# Patient Record
Sex: Male | Born: 1970 | Race: Black or African American | Hispanic: No | Marital: Married | Smoking: Current every day smoker
Health system: Southern US, Community
[De-identification: ages and names within clinical notes are randomized; demographics above are authoritative.]

## PROBLEM LIST (undated history)

## (undated) DIAGNOSIS — E7800 Pure hypercholesterolemia, unspecified: Secondary | ICD-10-CM

## (undated) DIAGNOSIS — L309 Dermatitis, unspecified: Secondary | ICD-10-CM

## (undated) HISTORY — PX: HAND TENDON SURGERY: SHX663

## (undated) HISTORY — DX: Dermatitis, unspecified: L30.9

---

## 2005-09-25 ENCOUNTER — Emergency Department: Payer: Self-pay | Admitting: Emergency Medicine

## 2009-01-01 ENCOUNTER — Emergency Department: Payer: Self-pay | Admitting: Emergency Medicine

## 2009-01-13 ENCOUNTER — Ambulatory Visit: Payer: Self-pay | Admitting: Specialist

## 2016-06-13 ENCOUNTER — Emergency Department
Admission: EM | Admit: 2016-06-13 | Discharge: 2016-06-13 | Disposition: A | Discharge status: Home / Self Care | Payer: 59 | Attending: Emergency Medicine | Admitting: Emergency Medicine

## 2016-06-13 DIAGNOSIS — K529 Noninfective gastroenteritis and colitis, unspecified: Secondary | ICD-10-CM | POA: Insufficient documentation

## 2016-06-13 DIAGNOSIS — R112 Nausea with vomiting, unspecified: Secondary | ICD-10-CM | POA: Diagnosis present

## 2016-06-13 LAB — CK: Total CK: 122 U/L (ref 49–397)

## 2016-06-13 LAB — LIPASE, BLOOD: Lipase: 17 U/L (ref 11–51)

## 2016-06-13 LAB — CBC
HCT: 45.7 % (ref 40.0–52.0)
Hemoglobin: 15 g/dL (ref 13.0–18.0)
MCH: 26.1 pg (ref 26.0–34.0)
MCHC: 32.7 g/dL (ref 32.0–36.0)
MCV: 79.8 fL — ABNORMAL LOW (ref 80.0–100.0)
Platelets: 387 K/uL (ref 150–440)
RBC: 5.73 MIL/uL (ref 4.40–5.90)
RDW: 14.6 % — ABNORMAL HIGH (ref 11.5–14.5)
WBC: 16 K/uL — ABNORMAL HIGH (ref 3.8–10.6)

## 2016-06-13 LAB — URINALYSIS, COMPLETE (UACMP) WITH MICROSCOPIC
Bacteria, UA: NONE SEEN
Bilirubin Urine: NEGATIVE
Glucose, UA: NEGATIVE mg/dL
KETONES UR: 5 mg/dL — AB
Leukocytes, UA: NEGATIVE
Nitrite: NEGATIVE
PH: 5 (ref 5.0–8.0)
Protein, ur: 30 mg/dL — AB
SPECIFIC GRAVITY, URINE: 1.033 — AB (ref 1.005–1.030)
Squamous Epithelial / HPF: NONE SEEN

## 2016-06-13 LAB — COMPREHENSIVE METABOLIC PANEL
ALK PHOS: 92 U/L (ref 38–126)
ALT: 33 U/L (ref 17–63)
AST: 24 U/L (ref 15–41)
Albumin: 4.4 g/dL (ref 3.5–5.0)
Anion gap: 9 (ref 5–15)
BUN: 13 mg/dL (ref 6–20)
CALCIUM: 9.2 mg/dL (ref 8.9–10.3)
CO2: 27 mmol/L (ref 22–32)
CREATININE: 0.96 mg/dL (ref 0.61–1.24)
Chloride: 100 mmol/L — ABNORMAL LOW (ref 101–111)
GFR calc Af Amer: >60 mL/min (ref >60)
GFR calc non Af Amer: >60 mL/min (ref >60)
GLUCOSE: 108 mg/dL — AB (ref 65–99)
Potassium: 3.4 mmol/L — ABNORMAL LOW (ref 3.5–5.1)
SODIUM: 136 mmol/L (ref 135–145)
Total Bilirubin: 0.6 mg/dL (ref 0.3–1.2)
Total Protein: 8.7 g/dL — ABNORMAL HIGH (ref 6.5–8.1)

## 2016-06-13 MED ORDER — ONDANSETRON 4 MG PO TBDP: 4.0000 mg | ORAL_TABLET | Freq: Three times a day (TID) | ORAL | 0 refills | Status: DC | PRN

## 2016-06-13 MED ORDER — ONDANSETRON HCL 4 MG/2ML IJ SOLN
4.0000 mg | Freq: Once | INTRAMUSCULAR | Status: AC
  Administered 2016-06-13: 4 mg via INTRAVENOUS

## 2016-06-13 MED ORDER — METRONIDAZOLE 500 MG PO TABS
ORAL_TABLET | ORAL | Status: AC
  Filled 2016-06-13: qty 1

## 2016-06-13 MED ORDER — SODIUM CHLORIDE 0.9 % IV SOLN
1000.0000 mL | Freq: Once | INTRAVENOUS | Status: AC
  Administered 2016-06-13: 1000 mL via INTRAVENOUS

## 2016-06-13 NOTE — ED Notes (Signed)
AAOx3.  Skin warm and dry.  NAD 

## 2016-06-13 NOTE — ED Triage Notes (Signed)
C/O mid abdominal pain.  Onset of symptoms 0300.  Reports vomiting and nausea.  States abdominal pain began yesterday and vomiting started this morning at 0300.

## 2016-06-13 NOTE — ED Provider Notes (Signed)
[facility] Emergency Department Provider Note   ____________________________________________    I have reviewed the triage vital signs and the nursing notes.   HISTORY  Chief Complaint Abdominal Pain     HPI Donald Benitez is a 46 y.o. male who presents with complaints of nausea vomiting and diarrhea. Patient reports the symptoms going on over the last 2 days. He denies significant abdominal pain but does report diffuse abdominal cramping. No sick contacts reported. No fevers reported. Has had mild body aches. No recent travel.   No past medical history on file.  There are no active problems to display for this patient.   No past surgical history on file.  Prior to Admission medications   Medication Sig Start Date End Date Taking? Authorizing Provider  ondansetron (ZOFRAN ODT) 4 MG disintegrating tablet Take 1 tablet (4 mg total) by mouth every 8 (eight) hours as needed for nausea or vomiting. 06/13/16   Jene Every, MD     Allergies Patient has no known allergies.  No family history on file.  Social History Social History  Substance Use Topics  . Smoking status: Not on file  . Smokeless tobacco: Not on file  . Alcohol use Not on file    Review of Systems  Constitutional: No fever/chills Eyes: No visual changes.  ENT: No sore throat. Cardiovascular: Denies chest pain. Respiratory: Denies shortness of breath. Gastrointestinal: Nausea vomiting and diarrhea as above, nonbilious nonbloody Genitourinary: Negative for dysuria. Musculoskeletal: Negative for back pain. Skin: Negative for rash. Neurological: Negative for headaches or weakness   ____________________________________________   PHYSICAL EXAM:  VITAL SIGNS: ED Triage Vitals  Enc Vitals Group     BP 06/13/16 0922 112/77     Pulse Rate 06/13/16 0922 86     Resp 06/13/16 0922 18     Temp 06/13/16 0922 98.4 F (36.9 C)     Temp Source 06/13/16 0922 Oral     SpO2  06/13/16 0922 100 %     Weight 06/13/16 0922 195 lb (88.5 kg)     Height 06/13/16 0922  (1.676 m)     Head Circumference --      Peak Flow --      Pain Score 06/13/16 0927 7     Pain Loc --      Pain Edu? --      Excl. in GC? --     Constitutional: Alert and oriented. No acute distress. Pleasant and interactive Eyes: Conjunctivae are normal.   Nose: No congestion/rhinnorhea. Mouth/Throat: Mucous membranes are dry  Cardiovascular: Normal rate, regular rhythm. Grossly normal heart sounds.  Good peripheral circulation. Respiratory: Normal respiratory effort.  No retractions. Lungs CTAB. Gastrointestinal: Soft and nontender. No distention.  No CVA tenderness. Genitourinary: deferred Musculoskeletal:   Warm and well perfused Neurologic:  Normal speech and language. No gross focal neurologic deficits are appreciated.  Skin:  Skin is warm, dry and intact. No rash noted. Psychiatric: Mood and affect are normal. Speech and behavior are normal.  ____________________________________________   LABS (all labs ordered are listed, but only abnormal results are displayed)  Labs Reviewed  COMPREHENSIVE METABOLIC PANEL - Abnormal; Notable for the following:       Result Value   Potassium 3.4 (*)    Chloride 100 (*)    Glucose, Bld 108 (*)    Total Protein 8.7 (*)    All other components within normal limits  CBC - Abnormal; Notable for the following:  WBC 16.0 (*)    MCV 79.8 (*)    RDW 14.6 (*)    All other components within normal limits  URINALYSIS, COMPLETE (UACMP) WITH MICROSCOPIC - Abnormal; Notable for the following:    Color, Urine AMBER (*)    APPearance HAZY (*)    Specific Gravity, Urine 1.033 (*)    Hgb urine dipstick MODERATE (*)    Ketones, ur 5 (*)    Protein, ur 30 (*)    All other components within normal limits  LIPASE, BLOOD  CK    ____________________________________________  EKG  None ____________________________________________  RADIOLOGY  None ____________________________________________   PROCEDURES  Procedure(s) performed: No    Critical Care performed: No ____________________________________________   INITIAL IMPRESSION / ASSESSMENT AND PLAN / ED COURSE  Pertinent labs & imaging results that were available during my care of the patient were reviewed by me and considered in my medical decision making (see chart for details).  Patient overall well-appearing and in no acute distress. Abdominal exam is benign. Lab work demonstrated delayed white blood cell count, symptoms are most consistent with gastroenteritis, likely viral. This is likely the cause of his elevated white blood cell count, we will treat with IV Zofran and IV fluids and reevaluate   After fluids patient reported feeling significantly improved. I'll discharge with by mouth Zofran, return precautions discussed    ____________________________________________   FINAL CLINICAL IMPRESSION(S) / ED DIAGNOSES  Final diagnoses:  Gastroenteritis      NEW MEDICATIONS STARTED DURING THIS VISIT:  Discharge Medication List as of 06/13/2016  1:07 PM    START taking these medications   Details  ondansetron (ZOFRAN ODT) 4 MG disintegrating tablet Take 1 tablet (4 mg total) by mouth every 8 (eight) hours as needed for nausea or vomiting., Starting Mon 06/13/2016, Print         Note:  This document was prepared using Dragon voice recognition software and may include unintentional dictation errors.    Jene Every, MD 06/13/16 [phone]

## 2016-09-19 ENCOUNTER — Emergency Department
Admission: EM | Admit: 2016-09-19 | Discharge: 2016-09-19 | Disposition: A | Discharge status: Home / Self Care | Payer: 59 | Attending: Emergency Medicine | Admitting: Emergency Medicine

## 2016-09-19 ENCOUNTER — Encounter: Payer: Self-pay | Admitting: *Deleted

## 2016-09-19 ENCOUNTER — Emergency Department: Payer: 59

## 2016-09-19 DIAGNOSIS — S0502XA Injury of conjunctiva and corneal abrasion without foreign body, left eye, initial encounter: Secondary | ICD-10-CM | POA: Diagnosis not present

## 2016-09-19 DIAGNOSIS — F17210 Nicotine dependence, cigarettes, uncomplicated: Secondary | ICD-10-CM | POA: Insufficient documentation

## 2016-09-19 DIAGNOSIS — Y92410 Unspecified street and highway as the place of occurrence of the external cause: Secondary | ICD-10-CM | POA: Insufficient documentation

## 2016-09-19 DIAGNOSIS — Y999 Unspecified external cause status: Secondary | ICD-10-CM | POA: Insufficient documentation

## 2016-09-19 DIAGNOSIS — S161XXA Strain of muscle, fascia and tendon at neck level, initial encounter: Secondary | ICD-10-CM | POA: Insufficient documentation

## 2016-09-19 DIAGNOSIS — Y9389 Activity, other specified: Secondary | ICD-10-CM | POA: Diagnosis not present

## 2016-09-19 DIAGNOSIS — G44319 Acute post-traumatic headache, not intractable: Secondary | ICD-10-CM | POA: Insufficient documentation

## 2016-09-19 MED ORDER — KETOROLAC TROMETHAMINE 30 MG/ML IJ SOLN
30.0000 mg | Freq: Once | INTRAMUSCULAR | Status: AC
  Administered 2016-09-19: 30 mg via INTRAMUSCULAR
  Filled 2016-09-19: qty 1

## 2016-09-19 MED ORDER — FLUORESCEIN SODIUM 0.6 MG OP STRP
ORAL_STRIP | OPHTHALMIC | Status: AC
  Administered 2016-09-19: 1 via OPHTHALMIC
  Filled 2016-09-19: qty 1

## 2016-09-19 MED ORDER — FLUORESCEIN SODIUM 0.6 MG OP STRP
1.0000 | ORAL_STRIP | Freq: Once | OPHTHALMIC | Status: AC
  Administered 2016-09-19: 1 via OPHTHALMIC
  Filled 2016-09-19: qty 1

## 2016-09-19 MED ORDER — ORPHENADRINE CITRATE 30 MG/ML IJ SOLN
60.0000 mg | Freq: Once | INTRAMUSCULAR | Status: AC
  Administered 2016-09-19: 60 mg via INTRAMUSCULAR
  Filled 2016-09-19: qty 2

## 2016-09-19 MED ORDER — EYE WASH OPHTH SOLN
OPHTHALMIC | Status: AC
  Filled 2016-09-19: qty 118

## 2016-09-19 MED ORDER — KETOROLAC TROMETHAMINE 0.5 % OP SOLN: 1.0000 [drp] | Freq: Four times a day (QID) | OPHTHALMIC | 0 refills | Status: DC

## 2016-09-19 MED ORDER — POLYMYXIN B-TRIMETHOPRIM 10000-0.1 UNIT/ML-% OP SOLN: 2.0000 [drp] | Freq: Two times a day (BID) | OPHTHALMIC | 0 refills | Status: DC

## 2016-09-19 MED ORDER — TETRACAINE HCL 0.5 % OP SOLN
2.0000 [drp] | Freq: Once | OPHTHALMIC | Status: AC
  Administered 2016-09-19: 2 [drp] via OPHTHALMIC
  Filled 2016-09-19: qty 4

## 2016-09-19 MED ORDER — METHOCARBAMOL 500 MG PO TABS: 500.0000 mg | ORAL_TABLET | Freq: Four times a day (QID) | ORAL | 0 refills | Status: DC

## 2016-09-19 MED ORDER — TETRACAINE HCL 0.5 % OP SOLN
OPHTHALMIC | Status: AC
  Administered 2016-09-19: 2 [drp] via OPHTHALMIC
  Filled 2016-09-19: qty 4

## 2016-09-19 MED ORDER — MELOXICAM 15 MG PO TABS: 15.0000 mg | ORAL_TABLET | Freq: Every day | ORAL | 0 refills | Status: DC

## 2016-09-19 NOTE — ED Notes (Signed)
Pt made aware of medical need to stay after IM administration for monitoring. Pt states understanding but states "I'm good I dont need to stay."
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
## 2016-09-21 ENCOUNTER — Emergency Department
Admission: EM | Admit: 2016-09-21 | Discharge: 2016-09-21 | Disposition: A | Discharge status: Home / Self Care | Payer: 59 | Attending: Emergency Medicine | Admitting: Emergency Medicine

## 2016-09-21 ENCOUNTER — Encounter: Payer: Self-pay | Admitting: Emergency Medicine

## 2016-09-21 DIAGNOSIS — M62838 Other muscle spasm: Secondary | ICD-10-CM | POA: Diagnosis not present

## 2016-09-21 DIAGNOSIS — F17200 Nicotine dependence, unspecified, uncomplicated: Secondary | ICD-10-CM | POA: Insufficient documentation

## 2016-09-21 DIAGNOSIS — Y999 Unspecified external cause status: Secondary | ICD-10-CM | POA: Diagnosis not present

## 2016-09-21 DIAGNOSIS — Y92410 Unspecified street and highway as the place of occurrence of the external cause: Secondary | ICD-10-CM | POA: Diagnosis not present

## 2016-09-21 DIAGNOSIS — S161XXA Strain of muscle, fascia and tendon at neck level, initial encounter: Secondary | ICD-10-CM | POA: Diagnosis not present

## 2016-09-21 DIAGNOSIS — S199XXA Unspecified injury of neck, initial encounter: Secondary | ICD-10-CM | POA: Diagnosis present

## 2016-09-21 DIAGNOSIS — Y939 Activity, unspecified: Secondary | ICD-10-CM | POA: Diagnosis not present

## 2016-09-21 MED ORDER — CYCLOBENZAPRINE HCL 5 MG PO TABS: 5.0000 mg | ORAL_TABLET | Freq: Three times a day (TID) | ORAL | 0 refills | Status: DC | PRN

## 2016-09-21 MED ORDER — DEXAMETHASONE SODIUM PHOSPHATE 10 MG/ML IJ SOLN
10.0000 mg | Freq: Once | INTRAMUSCULAR | Status: AC
  Administered 2016-09-21: 10 mg via INTRAMUSCULAR
  Filled 2016-09-21: qty 1

## 2016-09-21 MED ORDER — CYCLOBENZAPRINE HCL 10 MG PO TABS
5.0000 mg | ORAL_TABLET | Freq: Once | ORAL | Status: AC
  Administered 2016-09-21: 5 mg via ORAL
  Filled 2016-09-21: qty 1

## 2016-09-21 MED ORDER — PREDNISONE 10 MG (21) PO TBPK: ORAL_TABLET | ORAL | 0 refills | Status: DC
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
## 2016-09-21 NOTE — Discharge Instructions (Signed)
Take medication as prescribed. Return to emergency department if symptoms worsen and follow-up with orthopedics as needed.

## 2016-09-21 NOTE — ED Notes (Signed)
First nurse: pt report neck and shoulder pain
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
## 2016-09-24 ENCOUNTER — Emergency Department
Admission: EM | Admit: 2016-09-24 | Discharge: 2016-09-24 | Disposition: A | Discharge status: Home / Self Care | Payer: 59 | Attending: Emergency Medicine | Admitting: Emergency Medicine

## 2016-09-24 ENCOUNTER — Encounter: Payer: Self-pay | Admitting: Emergency Medicine

## 2016-09-24 DIAGNOSIS — Y998 Other external cause status: Secondary | ICD-10-CM | POA: Diagnosis not present

## 2016-09-24 DIAGNOSIS — Y92410 Unspecified street and highway as the place of occurrence of the external cause: Secondary | ICD-10-CM | POA: Diagnosis not present

## 2016-09-24 DIAGNOSIS — Y9389 Activity, other specified: Secondary | ICD-10-CM | POA: Diagnosis not present

## 2016-09-24 DIAGNOSIS — S0990XA Unspecified injury of head, initial encounter: Secondary | ICD-10-CM

## 2016-09-24 DIAGNOSIS — S199XXA Unspecified injury of neck, initial encounter: Secondary | ICD-10-CM

## 2016-09-24 DIAGNOSIS — G44309 Post-traumatic headache, unspecified, not intractable: Secondary | ICD-10-CM | POA: Diagnosis not present

## 2016-09-24 DIAGNOSIS — R51 Headache: Secondary | ICD-10-CM | POA: Diagnosis present
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
## 2016-09-24 NOTE — Discharge Instructions (Signed)
Advise only extra strength Tylenol for headache until evaluation by neurologist.

## 2019-01-23 ENCOUNTER — Other Ambulatory Visit: Payer: Self-pay

## 2019-01-23 DIAGNOSIS — Z20822 Contact with and (suspected) exposure to covid-19: Secondary | ICD-10-CM

## 2019-01-27 ENCOUNTER — Telehealth: Payer: Self-pay | Admitting: General Practice

## 2019-01-27 LAB — NOVEL CORONAVIRUS, NAA: SARS-CoV-2, NAA: DETECTED — AB

## 2019-01-27 NOTE — Telephone Encounter (Signed)
Pt called for covid results.  Pleas call pt back at [phone] Thanks

## 2019-09-20 ENCOUNTER — Encounter: Payer: Self-pay | Admitting: Urology

## 2019-09-20 ENCOUNTER — Other Ambulatory Visit: Payer: Self-pay

## 2019-09-20 ENCOUNTER — Ambulatory Visit (INDEPENDENT_AMBULATORY_CARE_PROVIDER_SITE_OTHER): Payer: 59 | Admitting: Urology

## 2019-09-20 ENCOUNTER — Telehealth: Payer: Self-pay | Admitting: Urology

## 2019-09-20 VITALS — BP 122/75 | HR 71 | Ht 66.0 in | Wt 186.0 lb

## 2019-09-20 DIAGNOSIS — R3129 Other microscopic hematuria: Secondary | ICD-10-CM | POA: Diagnosis not present

## 2019-09-20 LAB — URINALYSIS, COMPLETE
Bilirubin, UA: NEGATIVE
Glucose, UA: NEGATIVE
Ketones, UA: NEGATIVE
Leukocytes,UA: NEGATIVE
Nitrite, UA: NEGATIVE
Protein,UA: NEGATIVE
Specific Gravity, UA: >1.03 — ABNORMAL HIGH (ref 1.005–1.030)
Urobilinogen, Ur: 1 mg/dL (ref 0.2–1.0)
pH, UA: 6 (ref 5.0–7.5)

## 2019-09-20 LAB — MICROSCOPIC EXAMINATION: Bacteria, UA: NONE SEEN

## 2019-09-20 NOTE — Progress Notes (Signed)
09/20/2019 8:49 AM   Donald Benitez [birth year] [id number]  Referring provider: Preston Fleeting, MD [address] [place],  [place] [address]  Chief Complaint  Patient presents with  . Hematuria    HPI: Donald Benitez is a 49 y.o. male seen at the request of Dr. Neita Goodnight for evaluation of microhematuria.   Urinalyses 08/11/2019 and 09/10/2019 dipstick positive for blood  No urine microscopy was ordered  Denies gross hematuria  Denies bothersome lower urinary tract symptoms  Denies flank, abdominal or pelvic pain  No previous history of urologic problems  1 pack/day smoker x22 years  PSA 1.1  PMH: History reviewed. No pertinent past medical history.  Surgical History: History reviewed. No pertinent surgical history.  Home Medications:  Allergies as of 09/20/2019   No Known Allergies     Medication List       Accurate as of September 20, 2019  8:49 AM. If you have any questions, ask your nurse or doctor.        cyclobenzaprine 5 MG tablet Commonly known as: FLEXERIL Take 1 tablet (5 mg total) by mouth 3 (three) times daily as needed.   hydrOXYzine 25 MG capsule Commonly known as: VISTARIL hydroxyzine pamoate 25 mg capsule   hydrOXYzine 25 MG tablet Commonly known as: ATARAX/VISTARIL Take 50 mg by mouth at bedtime.   ketorolac 0.5 % ophthalmic solution Commonly known as: Acular Place 1 drop into the right eye 4 (four) times daily.   meloxicam 15 MG tablet Commonly known as: MOBIC Take 1 tablet (15 mg total) by mouth daily.   methocarbamol 500 MG tablet Commonly known as: Robaxin Take 1 tablet (500 mg total) by mouth 4 (four) times daily.   ondansetron 4 MG disintegrating tablet Commonly known as: Zofran ODT Take 1 tablet (4 mg total) by mouth every 8 (eight) hours as needed for nausea or vomiting.   predniSONE 10 MG (21) Tbpk tablet Commonly known as: STERAPRED UNI-PAK 21 TAB Take 6 tablets on day 1. Take 5 tablets on day 2. Take  4 tablets on day 3. Take 3 tablets on day 4. Take 2 tablets on day 5. Take 1 tablets on day 6.   Protopic 0.03 % ointment Generic drug: tacrolimus Protopic 0.03 % topical ointment  APPLY A THIN LAYER TO THE AFFECTED AREA(S) AS NEEDED.   triamcinolone cream 0.5 % Commonly known as: KENALOG Apply topically 2 (two) times daily.   trimethoprim-polymyxin b ophthalmic solution Commonly known as: POLYTRIM Place 2 drops into the left eye 2 (two) times daily.       Allergies: No Known Allergies  Family History: History reviewed. No pertinent family history.  Social History:  reports that he has been smoking. He has never used smokeless tobacco. He reports current alcohol use. He reports that he does not use drugs.   Physical Exam: BP 122/75   Pulse 71   Ht 5\' 6"  (1.676 m)   Wt 186 lb (84.4 kg)   BMI 30.02 kg/m   Constitutional:  Alert and oriented, No acute distress. HEENT: [place] AT, moist mucus membranes.  Trachea midline, no masses. Cardiovascular: No clubbing, cyanosis, or edema. Respiratory: Normal respiratory effort, no increased work of breathing. GI: Abdomen is soft, nontender, nondistended, no abdominal masses GU: Phallus without lesions, testes descended bilaterally without masses or tenderness Skin: No rashes, bruises or suspicious lesions. Neurologic: Grossly intact, no focal deficits, moving all 4 extremities. Psychiatric: Normal mood and affect.  Laboratory Data:  Urinalysis Dipstick  1+ blood Microscopy 3-10 RBC   Assessment & Plan:    1. Microscopic hematuria  Based on American Urological Association practice guidelines asymptomatic microhematuria [facility]) is defined as three or greater red blood cells per high powered field on a properly collected urinary specimen in the absence of an obvious benign cause. A positive dipstick does not define AMH, and evaluation should be based solely on findings from microscopic examination of urinary sediment and not on a  dipstick reading.  Urinalysis with microscopy in the office today does show 3-10 RBCs  Based on age, tobacco history and urine microscopy he is intermediate risk stratification  Standard recommended evaluation of intermediate risk hematuria was discussed and will schedule renal ultrasound and cystoscopy   Riki Altes, MD  [facility] [address] [place], [place] [address] [phone]

## 2019-09-20 NOTE — Telephone Encounter (Addendum)
Urinalysis did show an abnormal amount of RBCs on microscopic examination.  Recommend scheduling a renal ultrasound and cystoscopy  I put in the ultrasound order.  He needs a cystoscopy appointment with review of ultrasound results

## 2019-09-23 NOTE — Telephone Encounter (Signed)
Notified patient as instructed, patient pleased. Discussed follow-up appointments, patient agrees  

## 2019-09-29 ENCOUNTER — Encounter: Payer: Self-pay | Admitting: Urology

## 2019-10-09 ENCOUNTER — Ambulatory Visit
Admission: RE | Admit: 2019-10-09 | Discharge: 2019-10-09 | Disposition: A | Discharge status: Home / Self Care | Payer: 59 | Source: Ambulatory Visit | Attending: Urology | Admitting: Urology

## 2019-10-09 ENCOUNTER — Other Ambulatory Visit: Payer: Self-pay

## 2019-10-09 DIAGNOSIS — R3129 Other microscopic hematuria: Secondary | ICD-10-CM | POA: Insufficient documentation

## 2019-10-23 NOTE — Progress Notes (Signed)
° °  10/24/2019  CC:  Chief Complaint  Patient presents with   Cysto    Indications: Microhematuria  HPI: Donald Benitez is a 49 y.o. male returns today for a cystoscopy.    RUS on 10/09/2019 revealed mild debris within urinary bladder. Nonobstructing 7 mm calculus mid left kidney. Study otherwise unremarkable   Blood pressure 122/79, pulse 65, height 5\' 6"  (1.676 m), weight 186 lb (84.4 kg). NED. A&Ox3.   No respiratory distress   Abd soft, NT, ND Normal phallus with bilateral descended testicles  Cystoscopy Procedure Note  Patient identification was confirmed, informed consent was obtained, and patient was prepped using Betadine solution.  Lidocaine jelly was administered per urethral meatus.     Pre-Procedure: - Inspection reveals a normal caliber ureteral meatus.  Procedure: The flexible cystoscope was introduced without difficulty - No urethral strictures/lesions are present. - Mild lateral lobe enlargement with hypervascularity - Normal bladder neck - Bilateral ureteral orifices identified - Bladder mucosa  reveals no ulcers, tumors, or lesions - No bladder stones - No trabeculation    Retroflexion shows no abnormalities.    Post-Procedure: - Patient tolerated the procedure well  Pertinent image: CLINICAL DATA:  Microhematuria  EXAM: RENAL / URINARY TRACT ULTRASOUND COMPLETE  COMPARISON:  None.  FINDINGS: Right Kidney:  Renal measurements: 12.2 x 5.1 x 6.1 cm = volume: 198 mL. Echogenicity and renal cortical thickness are within normal limits. No mass, perinephric fluid, or hydronephrosis visualized. No sonographically demonstrable calculus or ureterectasis.  Left Kidney:  Renal measurements: 12.9 x 5.9 x 5.5 cm = volume: 217 mL. Echogenicity and renal cortical thickness are within normal limits. No mass, perinephric fluid, or hydronephrosis visualized. There is a 7 mm calculus in the mid left kidney. No  ureterectasis.  Bladder:  There is mild debris in the bladder. Urinary bladder wall thickness is normal. Flow from each distal ureter seen in the bladder.  Other:  None.  IMPRESSION: 1.  Mild debris within urinary bladder.  2.  Nonobstructing 7 mm calculus mid left kidney.  3.  Study otherwise unremarkable.   Electronically Signed   By: III M.D.   On: 10/10/2019 08:28  I have personally reviewed the images and agree with radiologist interpretation.     Assessment/ Plan:  1. Microscopic hematuria -No significant abnormalities on cystoscopy -He does have inflammatory changes of the prostate and a nonobstructing renal calculus which could be sources of his microhematuria  2.  Nephrolithiasis -Options were discussed surveillance versus shockwave lithotripsy  -KUB ordered to see if stone visualized on plain x-ray and if he desires surveillance 76-month follow-up with KUB  -Patient declined intervention at this time and elected observation.    8-month, am acting as a scribe for Dr. Georges Mouse C. Alyscia Carmon,  I have reviewed the above documentation for accuracy and completeness, and I agree with the above.   Lorin Picket, MD

## 2019-10-24 ENCOUNTER — Ambulatory Visit
Admission: RE | Admit: 2019-10-24 | Discharge: 2019-10-24 | Disposition: A | Discharge status: Home / Self Care | Payer: 59 | Source: Ambulatory Visit | Attending: Urology | Admitting: Urology

## 2019-10-24 ENCOUNTER — Ambulatory Visit (INDEPENDENT_AMBULATORY_CARE_PROVIDER_SITE_OTHER): Payer: 59 | Admitting: Urology

## 2019-10-24 ENCOUNTER — Ambulatory Visit
Admission: RE | Admit: 2019-10-24 | Discharge: 2019-10-24 | Disposition: A | Discharge status: Home / Self Care | Payer: 59 | Attending: Urology | Admitting: Urology

## 2019-10-24 ENCOUNTER — Encounter: Payer: Self-pay | Admitting: Urology

## 2019-10-24 ENCOUNTER — Other Ambulatory Visit: Payer: Self-pay

## 2019-10-24 VITALS — BP 122/79 | HR 65 | Ht 66.0 in | Wt 186.0 lb

## 2019-10-24 DIAGNOSIS — N200 Calculus of kidney: Secondary | ICD-10-CM

## 2019-10-24 DIAGNOSIS — R3129 Other microscopic hematuria: Secondary | ICD-10-CM | POA: Diagnosis not present

## 2019-10-24 LAB — URINALYSIS, COMPLETE
Bilirubin, UA: NEGATIVE
Glucose, UA: NEGATIVE
Ketones, UA: NEGATIVE
Leukocytes,UA: NEGATIVE
Nitrite, UA: NEGATIVE
Protein,UA: NEGATIVE
Specific Gravity, UA: >1.03 — ABNORMAL HIGH (ref 1.005–1.030)
Urobilinogen, Ur: 0.2 mg/dL (ref 0.2–1.0)
pH, UA: 5 (ref 5.0–7.5)

## 2019-10-24 LAB — MICROSCOPIC EXAMINATION: Bacteria, UA: NONE SEEN

## 2019-10-25 ENCOUNTER — Other Ambulatory Visit: Payer: Self-pay | Admitting: *Deleted

## 2019-10-25 ENCOUNTER — Telehealth: Payer: Self-pay | Admitting: *Deleted

## 2019-10-25 ENCOUNTER — Other Ambulatory Visit: Payer: Self-pay | Admitting: Family Medicine

## 2019-10-25 DIAGNOSIS — N200 Calculus of kidney: Secondary | ICD-10-CM

## 2019-10-25 NOTE — Telephone Encounter (Signed)
Patient notified and voiced understanding. A order for KUB was entered.

## 2019-10-25 NOTE — Progress Notes (Signed)
Error

## 2019-10-25 NOTE — Telephone Encounter (Signed)
-----   Message from Riki Altes, MD sent at 10/25/2019 10:49 AM EDT ----- Larina Bras not seen in left kidney on kub. ? Calculus in left ureter.  Recc repeat kub in 2 weeeks. Will call with results

## 2019-12-05 ENCOUNTER — Other Ambulatory Visit: Payer: Self-pay

## 2019-12-05 ENCOUNTER — Encounter: Payer: Self-pay | Admitting: Emergency Medicine

## 2019-12-05 ENCOUNTER — Emergency Department
Admission: EM | Admit: 2019-12-05 | Discharge: 2019-12-05 | Disposition: A | Discharge status: Home / Self Care | Payer: 59 | Attending: Student in an Organized Health Care Education/Training Program | Admitting: Student in an Organized Health Care Education/Training Program

## 2019-12-05 DIAGNOSIS — F17200 Nicotine dependence, unspecified, uncomplicated: Secondary | ICD-10-CM | POA: Insufficient documentation

## 2019-12-05 DIAGNOSIS — R519 Headache, unspecified: Secondary | ICD-10-CM | POA: Insufficient documentation

## 2019-12-05 DIAGNOSIS — Z20822 Contact with and (suspected) exposure to covid-19: Secondary | ICD-10-CM | POA: Diagnosis not present

## 2019-12-05 LAB — RESPIRATORY PANEL BY RT PCR (FLU A&B, COVID)
Influenza A by PCR: NEGATIVE
Influenza B by PCR: NEGATIVE
SARS Coronavirus 2 by RT PCR: NEGATIVE

## 2019-12-05 NOTE — ED Triage Notes (Signed)
Pt comes into the ED via POV c/o headache that started last night.  Pt states it was a throbbing pain and it radiated over the right eye.  No h/o migraines.  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  Pt states the pain decreased with OTC medication. Pt neurologically intact at this time and in NAD.

## 2019-12-05 NOTE — ED Provider Notes (Signed)
[facility] Emergency Department Provider Note ____________________________________________  Time seen: 1955  I have reviewed the triage vital signs and the nursing notes.  HISTORY  Chief Complaint  Headache  HPI Donald Benitez is a 49 y.o. male presents himself to the ED for evaluation of a headache.   Patient describes onset of headache last night he describes as throbbing pain that radiates over the right brow.  Patient denies a history of migraines or chronic headaches.  He states that the persistent pain is what prompted him to report to the ED for evaluation.  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  He did take over-the-counter ibuprofen for his headache pain, and reports some improvement of the symptoms.  He now reports his pain at a 3 out of 10.  He denies any vision changes, weakness, vertigo, dizziness, syncope, or vomiting.  Patient reports he had confirmed Covid last year in December.  He has since received the Covid vaccine.  History reviewed. No pertinent past medical history.  There are no problems to display for this patient.   History reviewed. No pertinent surgical history.  Prior to Admission medications   Medication Sig Start Date End Date Taking? Authorizing Provider  hydrOXYzine (ATARAX/VISTARIL) 25 MG tablet Take 50 mg by mouth at bedtime. 09/11/19   [provider]  hydrOXYzine (VISTARIL) 25 MG capsule hydroxyzine pamoate 25 mg capsule    [provider]  tacrolimus (PROTOPIC) 0.03 % ointment Protopic 0.03 % topical ointment  APPLY A THIN LAYER TO THE AFFECTED AREA(S) AS NEEDED.    [provider]  triamcinolone cream (KENALOG) 0.5 % Apply topically 2 (two) times daily. 09/12/19   [provider]    Allergies Patient has no known allergies.  History reviewed. No pertinent family history.  Social History Social History   Tobacco Use  . Smoking status: Current Every  Day Smoker  . Smokeless tobacco: Never Used  Substance Use Topics  . Alcohol use: Yes  . Drug use: No    Review of Systems  Constitutional: Negative for fever. Eyes: Negative for visual changes. ENT: Negative for sore throat. Cardiovascular: Negative for chest pain. Respiratory: Negative for shortness of breath. Gastrointestinal: Negative for abdominal pain, vomiting.  Reports nausea and diarrhea. Genitourinary: Negative for dysuria. Musculoskeletal: Negative for back pain. Skin: Negative for rash. Neurological: Negative for focal weakness or numbness.  Reports headache as above. ____________________________________________  PHYSICAL EXAM:  VITAL SIGNS: ED Triage Vitals [12/05/19 1600]  Enc Vitals Group     BP (!) 143/77     Pulse Rate 79     Resp 18     Temp 99 F (37.2 C)     Temp Source Oral     SpO2 99 %     Weight 178 lb (80.7 kg)     Height 5\' 6"  (1.676 m)     Head Circumference      Peak Flow      Pain Score 5     Pain Loc      Pain Edu?      Excl. in GC?     Constitutional: Alert and oriented. Well appearing and in no distress.  GCS = 15 Head: Normocephalic and atraumatic. Eyes: Conjunctivae are normal. PERRL. Normal extraocular movements and fundi bilaterally. Ears: Canals clear. TMs intact bilaterally. Nose: No congestion/rhinorrhea/epistaxis. Mouth/Throat: Mucous membranes are moist. Neck: Supple. No thyromegaly. Hematological/Lymphatic/Immunological: No cervical lymphadenopathy. Cardiovascular: Normal rate, regular rhythm. Normal distal  pulses. Respiratory: Normal respiratory effort. No wheezes/rales/rhonchi. Gastrointestinal: Soft and nontender. No distention. Musculoskeletal: Nontender with normal range of motion in all extremities.  Neurologic: Cranial nerves II to XII grossly intact.  Normal gait without ataxia. Normal speech and language. No gross focal neurologic deficits are appreciated. Skin:  Skin is warm, dry and intact. No rash  noted. Psychiatric: Mood and affect are normal. Patient exhibits appropriate insight and judgment. ____________________________________________   LABS (pertinent positives/negatives) Labs Reviewed  RESPIRATORY PANEL BY RT PCR (FLU A&B, COVID)  ____________________________________________  PROCEDURES  Procedures ____________________________________________  INITIAL IMPRESSION / ASSESSMENT AND PLAN / ED COURSE  Patient with ED evaluation of a frontal headache with onset last night.  Patient denies any preceding prodrome, trauma, or history of headaches.  He also denies any visual disturbance, tinnitus, vertigo, or syncope.  He has noted some generalized malaise as well as some nausea and some loose stools.  Patient presented with improvement of his symptoms at the dose over-the-counter medications.  He did request a Covid test given his symptoms, and noted that a coworker had similar symptoms.  Patient will will discharge at this time with instruction to continue to monitor and treat symptoms with over-the-counter medications.  He will follow-up with his primary provider or return to the ED if needed.  A work note is provided as requested.  Donald Benitez was evaluated in Emergency Department on 12/05/2019 for the symptoms described in the history of present illness. He was evaluated in the context of the global COVID-19 pandemic, which necessitated consideration that the patient might be at risk for infection with the SARS-CoV-2 virus that causes COVID-19. Institutional protocols and algorithms that pertain to the evaluation of patients at risk for COVID-19 are in a state of rapid change based on information released by regulatory bodies including the CDC and federal and state organizations. These policies and algorithms were followed during the patient's care in the ED. ____________________________________________  FINAL CLINICAL IMPRESSION(S) / ED DIAGNOSES  Final diagnoses:  Acute  nonintractable headache, unspecified headache type      Lissa Hoard, PA-C 12/05/19 2020    Willy Eddy, MD 12/05/19 2045

## 2019-12-05 NOTE — Discharge Instructions (Addendum)
Your exam is normal at this time. Take OTC Tylenol and Motrin for headache pain. Follow-up with your provider or return for continued symptoms. You may call back to check the results of your Covid test.

## 2020-02-01 ENCOUNTER — Other Ambulatory Visit: Payer: Self-pay

## 2020-02-01 ENCOUNTER — Emergency Department: Payer: 59

## 2020-02-01 ENCOUNTER — Emergency Department
Admission: EM | Admit: 2020-02-01 | Discharge: 2020-02-01 | Disposition: A | Discharge status: Home / Self Care | Payer: 59 | Attending: Emergency Medicine | Admitting: Emergency Medicine

## 2020-02-01 DIAGNOSIS — M7712 Lateral epicondylitis, left elbow: Secondary | ICD-10-CM

## 2020-02-01 DIAGNOSIS — M79602 Pain in left arm: Secondary | ICD-10-CM | POA: Diagnosis present

## 2020-02-01 DIAGNOSIS — F17200 Nicotine dependence, unspecified, uncomplicated: Secondary | ICD-10-CM | POA: Insufficient documentation

## 2020-02-01 MED ORDER — PREDNISONE 10 MG PO TABS: ORAL_TABLET | ORAL | 0 refills | Status: DC

## 2020-02-01 NOTE — ED Triage Notes (Signed)
Pt to ER with c/o left arm pain for last week.  PT denies known injury.  Pt states left work today to be seen.

## 2020-02-01 NOTE — ED Provider Notes (Signed)
[facility] Emergency Department Provider Note  ____________________________________________   Event Date/Time   First MD Initiated Contact with Patient 02/01/20 1307     (approximate)  I have reviewed the triage vital signs and the nursing notes.   HISTORY  Chief Complaint Arm Pain   HPI Donald Benitez is a 49 y.o. male to the ED with complaint of left arm pain for the last week.  Patient denies any known injury but states that last week he played basketball with his son.  He states that there was no fall or injury.  For the last 2 days he has been taking ibuprofen 600 mg twice daily without any relief.  He states that today while at work he began having more pain with movement.  Patient denies any shortness of breath, dizziness, chest pain, diaphoresis or previous cardiac issues.  He has also been using topical cream with little relief.  He rates pain as an 8 out of 10.       No past medical history on file.  There are no problems to display for this patient.   No past surgical history on file.  Prior to Admission medications   Medication Sig Start Date End Date Taking? Authorizing Provider  hydrOXYzine (ATARAX/VISTARIL) 25 MG tablet Take 50 mg by mouth at bedtime. 09/11/19   [provider]  hydrOXYzine (VISTARIL) 25 MG capsule hydroxyzine pamoate 25 mg capsule    [provider]  predniSONE (DELTASONE) 10 MG tablet 3 tablets once a day for 5 days 02/01/20   Tommi Rumps, PA-C  tacrolimus (PROTOPIC) 0.03 % ointment Protopic 0.03 % topical ointment  APPLY A THIN LAYER TO THE AFFECTED AREA(S) AS NEEDED.    [provider]  triamcinolone cream (KENALOG) 0.5 % Apply topically 2 (two) times daily. 09/12/19   [provider]    Allergies Patient has no known allergies.  No family history on file.  Social History Social History   Tobacco Use   Smoking status: Current Every Day Smoker   Smokeless tobacco:  Never Used  Substance Use Topics   Alcohol use: Yes   Drug use: No    Review of Systems Constitutional: No fever/chills Eyes: No visual changes. Cardiovascular: Denies chest pain. Respiratory: Denies shortness of breath. Gastrointestinal: No abdominal pain.  No nausea, no vomiting.  No diarrhea.   Genitourinary: Negative for dysuria. Musculoskeletal: Positive for left arm pain. Skin: Negative for rash. Neurological: Negative for headaches, focal weakness or numbness.  ____________________________________________   PHYSICAL EXAM:  VITAL SIGNS: ED Triage Vitals  Enc Vitals Group     BP 02/01/20 1245 117/76     Pulse Rate 02/01/20 1245 76     Resp 02/01/20 1245 18     Temp 02/01/20 1245 98.5 F (36.9 C)     Temp Source 02/01/20 1245 Oral     SpO2 02/01/20 1245 98 %     Weight 02/01/20 1243 180 lb (81.6 kg)     Height 02/01/20 1243 5\' 6"  (1.676 m)     Head Circumference --      Peak Flow --      Pain Score 02/01/20 1243 8     Pain Loc --      Pain Edu? --      Excl. in GC? --     Constitutional: Alert and oriented. Well appearing and in no acute distress. Eyes: Conjunctivae are normal.  Head: Atraumatic. Neck: No stridor.   Cardiovascular: Normal  rate, regular rhythm. Grossly normal heart sounds.  Good peripheral circulation. Respiratory: Normal respiratory effort.  No retractions. Lungs CTAB. Gastrointestinal: Soft and nontender. No distention.  Musculoskeletal: On examination of left arm there is no point tenderness on palpation of the [facility] joint or upper extremity.  Patient has moderate tenderness on palpation of the lateral epicondylar area.  No point tenderness is noted with wrist or hand.  No soft tissue edema, ecchymosis or erythema noted.  Pulses are present bilaterally.  Grip strength is equal bilaterally but increased pain on the left in the lateral epicondylar area.  Range of motion is slightly guarded due to discomfort but no crepitus is  appreciated. Neurologic:  Normal speech and language. No gross focal neurologic deficits are appreciated.  Skin:  Skin is warm, dry and intact.  Psychiatric: Mood and affect are normal. Speech and behavior are normal.  ____________________________________________   LABS (all labs ordered are listed, but only abnormal results are displayed)  Labs Reviewed - No data to display ____________________________________________   RADIOLOGY I, Tommi Rumps, personally viewed and evaluated these images (plain radiographs) as part of my medical decision making, as well as reviewing the written report by the radiologist.   Official radiology report(s): DG Forearm Left  Result Date: 02/01/2020 CLINICAL DATA:  Left arm pain near elbow x 1 week radiating up and down arm with no known injury. EXAM: LEFT FOREARM - 2 VIEW COMPARISON:  None. FINDINGS: There is no evidence of fracture or other focal bone lesions. Soft tissues are unremarkable. IMPRESSION: Negative. Electronically Signed   By: Emmaline Kluver M.D.   On: 02/01/2020 13:57    ____________________________________________   PROCEDURES  Procedure(s) performed (including Critical Care):  Procedures   ____________________________________________   INITIAL IMPRESSION / ASSESSMENT AND PLAN / ED COURSE  As part of my medical decision making, I reviewed the following data within the electronic MEDICAL RECORD NUMBER Notes from prior ED visits and [place] Controlled Substance Database  49 year old male presents to the ED with complaint of left forearm pain for 1 week without history of injury.  Patient has taken ibuprofen for the last 2 days without any relief.  Physical exam was unremarkable with the exception of being tender over the lateral epicondylar area.  X-rays were negative for any acute bony changes.  Patient was made aware that this is routinely known as "tennis elbow".  Patient is encouraged to get a tennis elbow splint which is a  Velcro strap that he can apply across the area that is tender.  He is to discontinue ibuprofen as is not working for him and began taking prednisone 30 mg daily for the next 5 days.  He can then return back to the ibuprofen routinely 3 times a day.  He is encouraged to use ice and use his elbow brace for approximately 2 to 3 weeks.  He is to follow-up with orthopedics if any continued problems or his primary care provider.  ____________________________________________   FINAL CLINICAL IMPRESSION(S) / ED DIAGNOSES  Final diagnoses:  Lateral epicondylitis of left elbow     ED Discharge Orders         Ordered    predniSONE (DELTASONE) 10 MG tablet        02/01/20 1414          *Please note:  Donald Benitez was evaluated in Emergency Department on 02/01/2020 for the symptoms described in the history of present illness. He was evaluated in the context of the global  COVID-19 pandemic, which necessitated consideration that the patient might be at risk for infection with the SARS-CoV-2 virus that causes COVID-19. Institutional protocols and algorithms that pertain to the evaluation of patients at risk for COVID-19 are in a state of rapid change based on information released by regulatory bodies including the CDC and federal and state organizations. These policies and algorithms were followed during the patient's care in the ED.  Some ED evaluations and interventions may be delayed as a result of limited staffing during and the pandemic.*   Note:  This document was prepared using Dragon voice recognition software and may include unintentional dictation errors.    Tommi Rumps, PA-C 02/01/20 1557    Delton Pra[place], MD 02/02/20 1026

## 2020-02-01 NOTE — Discharge Instructions (Addendum)
Obtain a tennis elbow splint at any pharmacy, [facility] or [facility].  These will be found in the sports area and look like a straight Velcro band that you place on the tender area of your forearm.  You may have to wear this for 2 to 3 weeks.  Begin taking the prednisone that was sent to your pharmacy this is 3 tablets once a day for the next 5 days.  After that you may return to taking ibuprofen 3 times a day for inflammation.  Ice applied to the elbow can help with pain.  You may follow-up with your primary care provider or Dr. Deeann Saint who is an orthopedist.  His contact information is listed on your discharge papers.  The emergency department does not make follow-up appointments so call your primary care doctor or Dr. Hyacinth Meeker on Monday.

## 2021-04-09 ENCOUNTER — Emergency Department: Payer: Self-pay

## 2021-04-09 ENCOUNTER — Emergency Department
Admission: EM | Admit: 2021-04-09 | Discharge: 2021-04-09 | Disposition: A | Discharge status: Home / Self Care | Payer: Self-pay | Attending: Emergency Medicine | Admitting: Emergency Medicine

## 2021-04-09 ENCOUNTER — Other Ambulatory Visit: Payer: Self-pay

## 2021-04-09 DIAGNOSIS — R202 Paresthesia of skin: Secondary | ICD-10-CM | POA: Insufficient documentation

## 2021-04-09 DIAGNOSIS — R410 Disorientation, unspecified: Secondary | ICD-10-CM | POA: Insufficient documentation

## 2021-04-09 DIAGNOSIS — R519 Headache, unspecified: Secondary | ICD-10-CM | POA: Insufficient documentation

## 2021-04-09 DIAGNOSIS — D72829 Elevated white blood cell count, unspecified: Secondary | ICD-10-CM | POA: Insufficient documentation

## 2021-04-09 DIAGNOSIS — H538 Other visual disturbances: Secondary | ICD-10-CM | POA: Insufficient documentation

## 2021-04-09 LAB — CBC
HCT: 45.4 % (ref 39.0–52.0)
Hemoglobin: 14.2 g/dL (ref 13.0–17.0)
MCH: 25.7 pg — ABNORMAL LOW (ref 26.0–34.0)
MCHC: 31.3 g/dL (ref 30.0–36.0)
MCV: 82.1 fL (ref 80.0–100.0)
Platelets: 430 10*3/uL — ABNORMAL HIGH (ref 150–400)
RBC: 5.53 MIL/uL (ref 4.22–5.81)
RDW: 14.8 % (ref 11.5–15.5)
WBC: 14.1 10*3/uL — ABNORMAL HIGH (ref 4.0–10.5)
nRBC: 0 % (ref 0.0–0.2)

## 2021-04-09 LAB — COMPREHENSIVE METABOLIC PANEL
ALT: 25 U/L (ref 0–44)
AST: 19 U/L (ref 15–41)
Albumin: 3.9 g/dL (ref 3.5–5.0)
Alkaline Phosphatase: 80 U/L (ref 38–126)
Anion gap: 5 (ref 5–15)
BUN: 16 mg/dL (ref 6–20)
CO2: 27 mmol/L (ref 22–32)
Calcium: 9.2 mg/dL (ref 8.9–10.3)
Chloride: 105 mmol/L (ref 98–111)
Creatinine, Ser: 0.93 mg/dL (ref 0.61–1.24)
GFR, Estimated: >60 mL/min (ref >60)
Glucose, Bld: 88 mg/dL (ref 70–99)
Potassium: 3.8 mmol/L (ref 3.5–5.1)
Sodium: 137 mmol/L (ref 135–145)
Total Bilirubin: 0.6 mg/dL (ref 0.3–1.2)
Total Protein: 7.9 g/dL (ref 6.5–8.1)

## 2021-04-09 LAB — TROPONIN I (HIGH SENSITIVITY): Troponin I (High Sensitivity): 3 ng/L (ref <18)

## 2021-04-09 NOTE — Discharge Instructions (Signed)
Follow-up with a primary care doctor.  Return to the ER for new, worsening, or persistent severe headache, vision changes, blind spots, weakness or numbness, difficulty speaking or walking, or any other new or worsening symptoms that concern you.

## 2021-04-09 NOTE — ED Triage Notes (Signed)
Pt in with co confusion, bilat hand tingling, headache and some visual disturbances since yesterday. States hx of the same and was told It was due to HTN.

## 2021-04-09 NOTE — ED Notes (Signed)
Pt ambulatory to ED room. States does not have regular PCP but in 11/22 had physical for work and was told BP was high. Has occasional HA and also c/o bilateral tingling in fingers. States last night and this morning was "seeing spots" and vision was a little blurry.  Denies CP, SOB. States "just wanted to get checked out".

## 2021-04-09 NOTE — ED Provider Notes (Signed)
[facility] Provider Note    Event Date/Time   First MD Initiated Contact with Patient 04/09/21 1135     (approximate)   History   Headache   HPI  Donald Benitez is a 51 y.o. male with no active medical problems who presents with a headache and spots in his vision since last night.  The symptoms have subsequently resolved.  The patient states that he has had this happen before and just wanted to come get checked out to make sure that his blood pressure was not elevated.  He states that the headache was gradual in onset and mainly frontal in location.  It resolved after he took some ibuprofen.  He had bright spots in his vision during the same time, and felt as if he was "disoriented" but when asked to describe this further he describes mild lightheadedness and the vision disturbance.  He denies any confusion or change in mental status.  He also reports that he had some tingling in both hands.  The patient denies any associated chest pain, difficulty breathing, vision loss, vertigo, fever, neck stiffness, or other acute symptoms.      Physical Exam   Triage Vital Signs: ED Triage Vitals  Enc Vitals Group     BP 04/09/21 1022 138/80     Pulse Rate 04/09/21 1022 73     Resp 04/09/21 1022 16     Temp 04/09/21 1022 98.9 F (37.2 C)     Temp Source 04/09/21 1022 Oral     SpO2 04/09/21 1022 97 %     Weight 04/09/21 1030 181 lb (82.1 kg)     Height 04/09/21 1030 5\' 6"  (1.676 m)     Head Circumference --      Peak Flow --      Pain Score 04/09/21 1030 0     Pain Loc --      Pain Edu? --      Excl. in GC? --     Most recent vital signs: Vitals:   04/09/21 1149 04/09/21 1200  BP:  125/82  Pulse: 62 (!) 57  Resp:  16  Temp:    SpO2: 97% 98%     General: Awake, no distress.  CV:  Good peripheral perfusion.  Resp:  Normal effort.  Abd:  No distention.  Other:  5/5 motor strength and intact sensation to bilateral upper and lower extremities.   Cranial nerves III through XII grossly intact.  EOMI.  PERRLA.  No photophobia.  Neck supple with full range of motion.  No meningeal signs.  No pronator drift.  No ataxia on finger-to-nose.   ED Results / Procedures / Treatments   Labs (all labs ordered are listed, but only abnormal results are displayed) Labs Reviewed  CBC - Abnormal; Notable for the following components:      Result Value   WBC 14.1 (*)    MCH 25.7 (*)    Platelets 430 (*)    All other components within normal limits  COMPREHENSIVE METABOLIC PANEL  TROPONIN I (HIGH SENSITIVITY)     EKG  ED ECG REPORT I, 04/11/21, the attending physician, personally viewed and interpreted this ECG.  Date: 04/09/2021 EKG Time: 1039 Rate: 62 Rhythm: normal sinus rhythm QRS Axis: normal Intervals: normal ST/T Wave abnormalities: normal Narrative Interpretation: no evidence of acute ischemia; no prior EKG available for comparison   RADIOLOGY  I independently viewed and interpreted the CT head images; there is no evidence  of ICH or other acute abnormality.  Radiology report confirms negative CT.  PROCEDURES:  Critical Care performed: No  Procedures   MEDICATIONS ORDERED IN ED: Medications - No data to display   IMPRESSION / MDM / ASSESSMENT AND PLAN / ED COURSE  I reviewed the triage vital signs and the nursing notes.  51 year old male with no active PMH presents with a now resolved headache along with some bright spots in his vision and a sensation of lightheadedness.  He has had this before.  He also reports a history of migraines which tend to be more severe.  He is asymptomatic currently.  Physical exam is unremarkable; vital signs are normal and thorough neurologic exam is normal as well.  CT head was obtained and is negative for acute findings.  Lab work-up is unremarkable except for leukocytosis which is nonspecific.  Troponin is negative.  Differential diagnosis includes, but is not limited to,  tension headache, complex migraine, or other benign etiology.  There is no evidence of intracranial hemorrhage, meningitis, acute stroke, or other concerning cause of the patient's symptoms.  He is asymptomatic at this time and stable for discharge home.  I counseled the patient on the results of the work-up.  I recommended he follow-up with a primary care doctor.  Return precautions given, and he expresses understanding.     FINAL CLINICAL IMPRESSION(S) / ED DIAGNOSES   Final diagnoses:  Acute nonintractable headache, unspecified headache type     Rx / DC Orders   ED Discharge Orders     None        Note:  This document was prepared using Dragon voice recognition software and may include unintentional dictation errors.    Dionne Bucy, MD 04/09/21 1242

## 2021-04-09 NOTE — ED Notes (Signed)
Sig pad not working. Verbal consent obtained for discharge.

## 2021-12-28 ENCOUNTER — Other Ambulatory Visit: Payer: Self-pay

## 2021-12-28 ENCOUNTER — Encounter: Payer: Self-pay | Admitting: Emergency Medicine

## 2021-12-28 ENCOUNTER — Emergency Department
Admission: EM | Admit: 2021-12-28 | Discharge: 2021-12-28 | Disposition: A | Discharge status: Home / Self Care | Payer: 59 | Attending: Emergency Medicine | Admitting: Emergency Medicine

## 2021-12-28 ENCOUNTER — Emergency Department: Payer: 59

## 2021-12-28 DIAGNOSIS — R0789 Other chest pain: Secondary | ICD-10-CM

## 2021-12-28 DIAGNOSIS — R0781 Pleurodynia: Secondary | ICD-10-CM | POA: Insufficient documentation

## 2021-12-28 MED ORDER — NAPROXEN 500 MG PO TABS: 500.0000 mg | ORAL_TABLET | Freq: Two times a day (BID) | ORAL | 0 refills | Status: DC

## 2021-12-28 NOTE — ED Triage Notes (Signed)
Pt to ER with c/o left sided rib pain.  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  Pt states pain is worse with deep inspiration.  Denies known injury.

## 2021-12-28 NOTE — ED Provider Notes (Signed)
[facility] Provider Note    Event Date/Time   First MD Initiated Contact with Patient 12/28/21 1029     (approximate)   History   Chest Pain (Rib pain)   HPI  Donald Benitez is a 51 y.o. male   to the ED with complaint of left lateral rib pain.  Patient states that he began having pain several days ago without known injury but states he does a lot of lifting at work.  He states this morning he moved a tall box by pushing it mostly with his left arm and had pain in his left ribs.  Pain is worse with deep inspiration.  No radiation of pain, shortness of breath, diaphoresis, indigestion or cardiac history.      Physical Exam   Triage Vital Signs: ED Triage Vitals  Enc Vitals Group     BP 12/28/21 1010 123/76     Pulse Rate 12/28/21 1010 64     Resp 12/28/21 1010 18     Temp 12/28/21 1010 98.5 F (36.9 C)     Temp src --      SpO2 12/28/21 1010 98 %     Weight 12/28/21 1011 190 lb (86.2 kg)     Height 12/28/21 1011 5\' 6"  (1.676 m)     Head Circumference --      Peak Flow --      Pain Score 12/28/21 1010 8     Pain Loc --      Pain Edu? --      Excl. in [place]? --     Most recent vital signs: Vitals:   12/28/21 1010 12/28/21 1158  BP: 123/76 120/70  Pulse: 64 68  Resp: 18 18  Temp: 98.5 F (36.9 C)   SpO2: 98% 98%     General: Awake, no distress.  CV:  Good peripheral perfusion.  Heart regular rate and rhythm. Resp:  Normal effort.  Lungs are clear bilaterally.  No point tenderness on palpation of the ribs lateral aspect.  No soft tissue injury or discoloration noted.  Range of motion increases pain as does coughing. Abd:  No distention.  Other:     ED Results / Procedures / Treatments   Labs (all labs ordered are listed, but only abnormal results are displayed) Labs Reviewed - No data to display    RADIOLOGY  Left lateral rib detail and chest x-ray were negative for acute rib fracture as interpreted by myself independent of  the radiologist and official radiology report for base left unilateral ribs and chest x-ray also did not show a fracture to explain patient's pain.   PROCEDURES:  Critical Care performed:   Procedures   MEDICATIONS ORDERED IN ED: Medications - No data to display   IMPRESSION / MDM / [place] / ED COURSE  I reviewed the triage vital signs and the nursing notes.   Differential diagnosis includes, but is not limited to, rib fracture, muscle skeletal pain, muscle strain, pneumonia.  51 year old male presents to the ED with complaint of left lateral rib pain that started 2 to 3 days ago but worsened after playing basketball with his child yesterday.  Patient is unaware of any known direct trauma to his ribs.  He has been taking over-the-counter medication without any relief.  X-rays were reassuring and patient was made aware.  A prescription for naproxen 500 mg twice daily with food was sent to his pharmacy.  He is encouraged to use ice  or heat to his ribs as needed for discomfort and follow-up with his PCP if any continued problems.      Patient's presentation is most consistent with acute complicated illness / injury requiring diagnostic workup.  FINAL CLINICAL IMPRESSION(S) / ED DIAGNOSES   Final diagnoses:  Chest wall pain     Rx / DC Orders   ED Discharge Orders          Ordered    naproxen (NAPROSYN) 500 MG tablet  2 times daily with meals        12/28/21 1150             Note:  This document was prepared using Dragon voice recognition software and may include unintentional dictation errors.   Tommi Rumps, PA-C 12/28/21 1419    Minna Antis, MD 12/28/21 1504

## 2021-12-28 NOTE — Discharge Instructions (Signed)
Follow-up with your primary care provider or urgent care if any continued problems.  You may discontinue taking the Advil and a prescription for naproxen was sent to the pharmacy to take twice a day with food.  You may use ice or heat to your ribs as needed for discomfort.  If additional pain medication is needed you may take Tylenol with this.

## 2022-01-11 ENCOUNTER — Emergency Department
Admission: EM | Admit: 2022-01-11 | Discharge: 2022-01-11 | Disposition: A | Discharge status: Home / Self Care | Payer: 59 | Attending: Emergency Medicine | Admitting: Emergency Medicine

## 2022-01-11 ENCOUNTER — Other Ambulatory Visit: Payer: Self-pay

## 2022-01-11 ENCOUNTER — Emergency Department: Payer: 59

## 2022-01-11 DIAGNOSIS — H538 Other visual disturbances: Secondary | ICD-10-CM | POA: Insufficient documentation

## 2022-01-11 DIAGNOSIS — F17210 Nicotine dependence, cigarettes, uncomplicated: Secondary | ICD-10-CM | POA: Diagnosis not present

## 2022-01-11 DIAGNOSIS — R110 Nausea: Secondary | ICD-10-CM | POA: Diagnosis not present

## 2022-01-11 LAB — COMPREHENSIVE METABOLIC PANEL
ALT: 23 U/L (ref 0–44)
AST: 18 U/L (ref 15–41)
Albumin: 3.9 g/dL (ref 3.5–5.0)
Alkaline Phosphatase: 82 U/L (ref 38–126)
Anion gap: 7 (ref 5–15)
BUN: 14 mg/dL (ref 6–20)
CO2: 24 mmol/L (ref 22–32)
Calcium: 9 mg/dL (ref 8.9–10.3)
Chloride: 107 mmol/L (ref 98–111)
Creatinine, Ser: 0.88 mg/dL (ref 0.61–1.24)
GFR, Estimated: >60 mL/min (ref >60)
Glucose, Bld: 91 mg/dL (ref 70–99)
Potassium: 3.6 mmol/L (ref 3.5–5.1)
Sodium: 138 mmol/L (ref 135–145)
Total Bilirubin: 0.7 mg/dL (ref 0.3–1.2)
Total Protein: 7.7 g/dL (ref 6.5–8.1)

## 2022-01-11 LAB — CBC
HCT: 43 % (ref 39.0–52.0)
Hemoglobin: 14 g/dL (ref 13.0–17.0)
MCH: 26 pg (ref 26.0–34.0)
MCHC: 32.6 g/dL (ref 30.0–36.0)
MCV: 79.8 fL — ABNORMAL LOW (ref 80.0–100.0)
Platelets: 414 10*3/uL — ABNORMAL HIGH (ref 150–400)
RBC: 5.39 MIL/uL (ref 4.22–5.81)
RDW: 14.5 % (ref 11.5–15.5)
WBC: 10.8 10*3/uL — ABNORMAL HIGH (ref 4.0–10.5)
nRBC: 0 % (ref 0.0–0.2)

## 2022-01-11 LAB — DIFFERENTIAL
Abs Immature Granulocytes: 0.07 10*3/uL (ref 0.00–0.07)
Basophils Absolute: 0.1 10*3/uL (ref 0.0–0.1)
Basophils Relative: 1 %
Eosinophils Absolute: 0.2 10*3/uL (ref 0.0–0.5)
Eosinophils Relative: 2 %
Immature Granulocytes: 1 %
Lymphocytes Relative: 28 %
Lymphs Abs: 3 10*3/uL (ref 0.7–4.0)
Monocytes Absolute: 1 10*3/uL (ref 0.1–1.0)
Monocytes Relative: 9 %
Neutro Abs: 6.5 10*3/uL (ref 1.7–7.7)
Neutrophils Relative %: 59 %

## 2022-01-11 MED ORDER — SODIUM CHLORIDE 0.9% FLUSH: 3.0000 mL | Freq: Once | INTRAVENOUS | Status: DC

## 2022-01-11 MED ORDER — ONDANSETRON 4 MG PO TBDP: 4.0000 mg | ORAL_TABLET | Freq: Three times a day (TID) | ORAL | 0 refills | Status: DC | PRN

## 2022-01-11 MED ORDER — METOCLOPRAMIDE HCL 10 MG PO TABS
10.0000 mg | ORAL_TABLET | Freq: Once | ORAL | Status: AC
Start: 2022-01-11 — End: 2022-01-11
  Administered 2022-01-11: 10 mg via ORAL
  Filled 2022-01-11: qty 1

## 2022-01-11 NOTE — ED Notes (Signed)
Pt visual acuity: R eye 20/20, left eye 20/20

## 2022-01-11 NOTE — ED Triage Notes (Signed)
Pt sts for the last four days he has been having blurred vision with extreme nausea that waxes and wanes.

## 2022-01-11 NOTE — Discharge Instructions (Addendum)
These follow-up with the ophthalmologist regarding your blurred vision.  You can take the Zofran as needed for nausea.  Please follow-up with primary care to discuss your nausea and to arrange your age-appropriate cancer screening.

## 2022-01-11 NOTE — ED Provider Notes (Signed)
[facility] Provider Note    Event Date/Time   First MD Initiated Contact with Patient 01/11/22 1836     (approximate)   History   Blurred Vision   HPI  SUEDE GREENAWALT is a 51 y.o. male with past medical history of tobacco use presents with several issues.  He has had blurred vision on and off for several weeks described as foggy and occasional double vision that seems to come and go.  Often occurs when he has a headache or after he has been looking at his phone.  Feels worse with near vision.  Denies numbness tingling weakness.  Occasionally has some dizziness.  The blurred vision came on last night and then he developed a headache.  He currently has some blurred vision right now but is denying headache.  The headaches are right-sided pounding have been going on for some time.  Thinks he has migraines.  They are often brought on by strong odors or looking at his phone.  He does not wear glasses or contacts has not seen an ophthalmologist lately.  Patient also endorses significant nausea over the past 3 to 4 days.  He does not have abdominal pain and is not vomiting and denies diarrhea.  He has not had much appetite and he typically smokes cigarettes but has not been able to smoke because it makes him sick on his stomach.  Patient denies weight loss night sweats.  Denies blood in his stool.     No past medical history on file.  There are no problems to display for this patient.    Physical Exam  Triage Vital Signs: ED Triage Vitals  Enc Vitals Group     BP 01/11/22 1818 125/79     Pulse Rate 01/11/22 1818 65     Resp 01/11/22 1818 17     Temp 01/11/22 1818 98.4 F (36.9 C)     Temp Source 01/11/22 1818 Oral     SpO2 01/11/22 1818 98 %     Weight 01/11/22 1820 190 lb (86.2 kg)     Height --      Head Circumference --      Peak Flow --      Pain Score 01/11/22 1820 0     Pain Loc --      Pain Edu? --      Excl. in GC? --     Most recent  vital signs: Vitals:   01/11/22 1818  BP: 125/79  Pulse: 65  Resp: 17  Temp: 98.4 F (36.9 C)  SpO2: 98%     General: Awake, no distress.  CV:  Good peripheral perfusion.  Resp:  Normal effort.  Abd:  No distention.  Neuro:             Awake, Alert, Oriented x 3  Other:  Aox3, nml speech  PERRL, EOMI, face symmetric, nml tongue movement  5/5 strength in the BL upper and lower extremities  Sensation grossly intact in the BL upper and lower extremities  Finger-nose-finger intact BL Visual fields full VA 20/20 BL   ED Results / Procedures / Treatments  Labs (all labs ordered are listed, but only abnormal results are displayed) Labs Reviewed  CBC - Abnormal; Notable for the following components:      Result Value   WBC 10.8 (*)    MCV 79.8 (*)    Platelets 414 (*)    All other components within normal limits  DIFFERENTIAL  COMPREHENSIVE METABOLIC PANEL     EKG  EKG interpretation performed by myself: NSR, nml axis, nml intervals, no acute ischemic changes    RADIOLOGY I reviewed and interpreted the CT scan of the brain which does not show any acute intracranial process    PROCEDURES:  Critical Care performed: No  Procedures  The patient is on the cardiac monitor to evaluate for evidence of arrhythmia and/or significant heart rate changes.   MEDICATIONS ORDERED IN ED: Medications  sodium chloride flush (NS) 0.9 % injection 3 mL (has no administration in time range)  metoCLOPramide (REGLAN) tablet 10 mg (10 mg Oral Given 01/11/22 1921)     IMPRESSION / MDM / ASSESSMENT AND PLAN / ED COURSE  I reviewed the triage vital signs and the nursing notes.                              Patient's presentation is most consistent with acute presentation with potential threat to life or bodily function.  Differential diagnosis includes, but is not limited to, primary refractive error, intracranial hypertension, CVA, myasthenia gravis, intracranial mass, migraine  headache, glaucoma, cataract  The patient is a 51 year old male presents with blurred vision nausea headaches.  The blurred vision has been going on for several weeks comes and goes described as foggy and occasional double vision, thinks it is binocular.  Is worse with near vision.  Denies associated neurologic symptoms other than headache.  Headache is sometimes associated with the blurred vision sometimes not.  It is typically right-sided throbbing and brought on by not eating or strong odors.  He has never been officially diagnosed with migraines but they do run in his family.  He does not have a headache currently did last night after he did not eat.  He has also had nausea which is only been present over the last 3 to 4 days without abdominal pain or chest pain.  He has not been able to smoke because of the nausea denies night sweats weight loss or blood in his stool.  Patient's vitals are reassuring he looks well his neurologic exam is nonfocal.  CT head obtained is negative for acute abnormality.  In terms of the blurred vision I am less concerned for acute CVA as it has been coming and going is worse with near vision and is without associated other neurologic symptoms.  Suspect that he could have a primarily refractive error which is leading to his headaches versus visual symptoms associated with migraine headache.  I am concerned about patient's new onset nausea about malignancy.  I encouraged him to follow-up with his primary care doctor for routine cancer screening likely colonoscopy endoscopy and patient may need lung cancer screening as well.  I did obtain a chest x-ray to screen for any obvious large lung mass.  Given Reglan in the ED both to help with potential migraine and his nausea.  I will refer him to ophthalmology for further evaluation of the blurred vision.       FINAL CLINICAL IMPRESSION(S) / ED DIAGNOSES   Final diagnoses:  Blurred vision  Nausea     Rx / DC Orders   ED  Discharge Orders          Ordered    ondansetron (ZOFRAN-ODT) 4 MG disintegrating tablet  Every 8 hours PRN        01/11/22 1937  Note:  This document was prepared using Dragon voice recognition software and may include unintentional dictation errors.   Georga Hacking, MD 01/11/22 [phone]

## 2023-01-26 ENCOUNTER — Emergency Department
Admission: EM | Admit: 2023-01-26 | Discharge: 2023-01-26 | Disposition: A | Discharge status: Home / Self Care | Payer: 59 | Attending: Emergency Medicine | Admitting: Emergency Medicine

## 2023-01-26 ENCOUNTER — Other Ambulatory Visit: Payer: Self-pay

## 2023-01-26 ENCOUNTER — Encounter: Payer: Self-pay | Admitting: Emergency Medicine

## 2023-01-26 ENCOUNTER — Emergency Department: Payer: 59

## 2023-01-26 DIAGNOSIS — J0100 Acute maxillary sinusitis, unspecified: Secondary | ICD-10-CM | POA: Insufficient documentation

## 2023-01-26 DIAGNOSIS — R0981 Nasal congestion: Secondary | ICD-10-CM | POA: Diagnosis present

## 2023-01-26 DIAGNOSIS — Z20822 Contact with and (suspected) exposure to covid-19: Secondary | ICD-10-CM | POA: Insufficient documentation

## 2023-01-26 LAB — RESP PANEL BY RT-PCR (RSV, FLU A&B, COVID)  RVPGX2
Influenza A by PCR: NEGATIVE
Influenza B by PCR: NEGATIVE
Resp Syncytial Virus by PCR: NEGATIVE
SARS Coronavirus 2 by RT PCR: NEGATIVE

## 2023-01-26 LAB — GROUP A STREP BY PCR: Group A Strep by PCR: NOT DETECTED

## 2023-01-26 MED ORDER — AMOXICILLIN-POT CLAVULANATE 875-125 MG PO TABS: 1.0000 | ORAL_TABLET | Freq: Two times a day (BID) | ORAL | 0 refills | Status: AC

## 2023-01-26 NOTE — ED Provider Notes (Signed)
[facility] Provider Note    Event Date/Time   First MD Initiated Contact with Patient 01/26/23 [phone]     (approximate)   History   Nasal Congestion   HPI  Donald Benitez is a 52 y.o. male who presents for evaluation of nasal congestion, a sore to the left side of his bottom lip, sinus pressure, and dry cough for the past 1 month.  He reports that he feels that it is worsening over the past 1 week.  He has not had any trouble swallowing or breathing.  He has not had any tongue swelling.  He denies any new medicines.  No allergies.  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  No fevers or chills.  No chest pain or shortness of breath.  No abdominal pain, nausea, vomiting, diarrhea.  There are no problems to display for this patient.         Physical Exam   Triage Vital Signs: ED Triage Vitals  Encounter Vitals Group     BP 01/26/23 0807 (!) 132/95     Systolic BP Percentile --      Diastolic BP Percentile --      Pulse Rate 01/26/23 0807 67     Resp 01/26/23 0807 18     Temp 01/26/23 0807 98.1 F (36.7 C)     Temp Source 01/26/23 0807 Oral     SpO2 01/26/23 0807 97 %     Weight 01/26/23 0817 190 lb 0.6 oz (86.2 kg)     Height 01/26/23 0817 5\' 6"  (1.676 m)     Head Circumference --      Peak Flow --      Pain Score 01/26/23 0803 0     Pain Loc --      Pain Education --      Exclude from Growth Chart --     Most recent vital signs: Vitals:   01/26/23 0807  BP: (!) 132/95  Pulse: 67  Resp: 18  Temp: 98.1 F (36.7 C)  SpO2: 97%    Physical Exam Vitals and nursing note reviewed.  Constitutional:      General: Awake and alert. No acute distress.    Appearance: Normal appearance. The patient is normal weight.  HENT:     Head: Normocephalic and atraumatic.     Mouth: Mucous membranes are moist.  Mild swelling to left lateral lip with small ulceration, tender to palpation.  No swelling to the other side of the  lip or upper lip.  No tongue swelling.  No intraoral swelling.  No voice change, no trismus, no drooling.  No other intraoral lesions noted. Tenderness over maxillary sinuses. Eyes:     General: PERRL. Normal EOMs        Right eye: No discharge.        Left eye: No discharge.     Conjunctiva/sclera: Conjunctivae normal.  Cardiovascular:     Rate and Rhythm: Normal rate and regular rhythm.     Pulses: Normal pulses.  Pulmonary:     Effort: Pulmonary effort is normal. No respiratory distress.     Breath sounds: Normal breath sounds.  Abdominal:     Abdomen is soft. There is no abdominal tenderness. No rebound or guarding. No distention. Musculoskeletal:        General: No swelling. Normal range of motion.     Cervical back: Normal range of motion and neck supple.  Skin:  General: Skin is warm and dry.     Capillary Refill: Capillary refill takes less than 2 seconds.     Findings: No rash.  Neurological:     Mental Status: The patient is awake and alert.      ED Results / Procedures / Treatments   Labs (all labs ordered are listed, but only abnormal results are displayed) Labs Reviewed  GROUP A STREP BY PCR  RESP PANEL BY RT-PCR (RSV, FLU A&B, COVID)  [id number]     EKG     RADIOLOGY I independently reviewed and interpreted imaging and agree with radiologists findings.     PROCEDURES:  Critical Care performed:   Procedures   MEDICATIONS ORDERED IN ED: Medications - No data to display   IMPRESSION / MDM / ASSESSMENT AND PLAN / ED COURSE  I reviewed the triage vital signs and the nursing notes.   Differential diagnosis includes, but is not limited to, viral etiology, HSV, herpangina, sinusitis, bronchitis.  Patient is awake and alert, hemodynamically stable and afebrile.  He is nontoxic in appearance.  Has mild swelling to the left lateral lip only with a small ulceration that appears to be viral.  It is nontender to palpation, no history of HSV.  No  intraoral abnormalities.  No tongue swelling, difficulty breathing or swallowing, do not suspect angioedema.  No known allergies, no history of angioedema, no new medications.  His symptoms of nasal congestion and dry cough appear to be viral in nature.  He works in a school with many sick contacts.  Given that he has had sinus pressure for over 2 weeks with this congestion, it is reasonable to treat for sinusitis.  He was started on antibiotics for this.  His COVID/flu/RSV, and strep swabs obtained in triage are negative.  His chest x-ray is normal.  He was given the appropriate follow-up information for otolaryngology and encouraged to follow-up.  He was given a work note for today per his request.  We discussed return precautions for signs of worsening symptoms or angioedema and importance of close outpatient follow-up.  Patient understands and agrees with plan.  He was discharged in stable condition.   Patient's presentation is most consistent with acute complicated illness / injury requiring diagnostic workup.    FINAL CLINICAL IMPRESSION(S) / ED DIAGNOSES   Final diagnoses:  Acute maxillary sinusitis, recurrence not specified     Rx / DC Orders   ED Discharge Orders          Ordered    amoxicillin-clavulanate (AUGMENTIN) 875-125 MG tablet  2 times daily        01/26/23 0935             Note:  This document was prepared using Dragon voice recognition software and may include unintentional dictation errors.   Jackelyn Hoehn, PA-C 01/26/23 1232    Corena Herter, MD 01/26/23 [phone]

## 2023-01-26 NOTE — Discharge Instructions (Addendum)
Your COVID, flu, RSV swabs were negative.  Please follow-up with your outpatient provider.  You may take the antibiotics for possible sinusitis.  Please return for any new, worsening, or change in symptoms including swelling of your tongue or upper lip, trouble swallowing or breathing, or any other concerns.  It was a pleasure caring for you today.

## 2023-01-26 NOTE — ED Triage Notes (Signed)
Pt to ED POV with nasal congestion, lip swelling, and cough for roughly one month. Pt denies fever/chills/SOB. Pt was sent home from work for a check up.  Hx cigarette usage No COPD/Asthma

## 2023-01-26 NOTE — ED Notes (Signed)
See triage note  Presents with nasal congestion and cough  States he felt like this for about 1 month  Has been seen for same  but feels like it is worse  Afebrile

## 2023-03-14 ENCOUNTER — Ambulatory Visit: Payer: 59 | Admitting: Family Medicine

## 2023-03-14 ENCOUNTER — Encounter: Payer: Self-pay | Admitting: Family Medicine

## 2023-03-14 VITALS — BP 127/85 | HR 64 | Ht 66.0 in | Wt 198.0 lb

## 2023-03-14 DIAGNOSIS — G43009 Migraine without aura, not intractable, without status migrainosus: Secondary | ICD-10-CM | POA: Insufficient documentation

## 2023-03-14 DIAGNOSIS — Z23 Encounter for immunization: Secondary | ICD-10-CM | POA: Diagnosis not present

## 2023-03-14 DIAGNOSIS — Z6831 Body mass index (BMI) 31.0-31.9, adult: Secondary | ICD-10-CM

## 2023-03-14 DIAGNOSIS — R42 Dizziness and giddiness: Secondary | ICD-10-CM

## 2023-03-14 DIAGNOSIS — I10 Essential (primary) hypertension: Secondary | ICD-10-CM | POA: Diagnosis not present

## 2023-03-14 DIAGNOSIS — L209 Atopic dermatitis, unspecified: Secondary | ICD-10-CM

## 2023-03-14 DIAGNOSIS — E782 Mixed hyperlipidemia: Secondary | ICD-10-CM

## 2023-03-14 DIAGNOSIS — E6609 Other obesity due to excess calories: Secondary | ICD-10-CM

## 2023-03-14 DIAGNOSIS — E66811 Obesity, class 1: Secondary | ICD-10-CM | POA: Insufficient documentation

## 2023-03-14 DIAGNOSIS — Z1159 Encounter for screening for other viral diseases: Secondary | ICD-10-CM

## 2023-03-14 DIAGNOSIS — L309 Dermatitis, unspecified: Secondary | ICD-10-CM | POA: Insufficient documentation

## 2023-03-14 DIAGNOSIS — Z1211 Encounter for screening for malignant neoplasm of colon: Secondary | ICD-10-CM

## 2023-03-14 DIAGNOSIS — Z114 Encounter for screening for human immunodeficiency virus [HIV]: Secondary | ICD-10-CM

## 2023-03-14 NOTE — Assessment & Plan Note (Signed)
Chronic eczema with severe flares, currently managed with avoidance of irritants. Not using current medications due to side effects and cost. Noted large patches and bumps on physical exam. Discussed potential use of tacrolimus ointment and triamcinolone cream. Dermatology referral necessary for further evaluation, including potential biopsy for folliculitis and epidermal cysts. - Refer to dermatology - Consider biopsy for folliculitis and epidermal cysts - Advise continued use of Dove sensitive skin products - Discuss potential use of tacrolimus ointment and triamcinolone cream

## 2023-03-14 NOTE — Assessment & Plan Note (Signed)
Recurrent headaches triggered by strong smells, localized to the right side, associated with visual disturbances and nausea. Occurs approximately once a month. Discussed over-the-counter medications and magnesium supplementation for symptom relief. Considered FMLA for work-related triggers if headaches persist. - Advise avoidance of known triggers - Recommend over-the-counter medications (Tylenol, Excedrin Migraine) - Discuss potential use of magnesium supplementation - Consider FMLA for work-related triggers if headaches persist

## 2023-03-14 NOTE — Assessment & Plan Note (Signed)
Obesity associated w/ htn Elevated diastolic blood pressure (85 mmHg) noted during the visit. No current antihypertensive medications. Discussed lifestyle modifications including reducing sodium and alcohol intake, and smoking cessation to manage blood pressure. Explained cardiovascular risks of unmanaged hypertension. - Recheck blood pressure - Advise lifestyle modifications including reducing sodium and alcohol intake - Encourage smoking cessation - Order lipid panel

## 2023-03-14 NOTE — Progress Notes (Signed)
New patient visit   Patient: Donald Benitez   DOB: [birth year]   53 y.o. Male  MRN: [id number] Visit Date: 03/14/2023  Today's healthcare provider: Ronnald Ramp, MD   Chief Complaint  Patient presents with   New Patient (Initial Visit)   Subjective    Donald Benitez is a 53 y.o. male who presents today as a new patient to establish care.   HPI   Establish Bp---dizziness especially getting out the bed. Headache--triggers with cologne. Last edited by Shelly Bombard, CMA on 03/14/2023  2:12 PM.       Discussed the use of AI scribe software for clinical note transcription with the patient, who gave verbal consent to proceed.  History of Present Illness   The patient, known to have a history of eczema, presents with complaints of intermittent dizziness and headaches. The dizziness is described as a feeling of grogginess and unsteadiness, particularly noticeable upon waking in the morning. The patient also reports experiencing headaches, which are localized to the right side of the head and over the eye. These headaches are associated with visual disturbances, such as seeing spots, and are often triggered by exposure to certain strong smells, such as cologne. The patient manages these headaches with over-the-counter medications and rest, which seem to provide relief.  The patient's eczema, which was first diagnosed in childhood, had been well-controlled until a flare-up triggered by exposure to fleas from a pet dog. The eczema primarily affects the patient's side and is managed with Dove sensitive skin soap. The patient has tried various creams and medications in the past, including hydroxyzine and tacrolimus ointment, but currently does not use any topical treatments due to his sedating effects and the cost of the creams.  The patient also reports a history of high blood pressure, which was noted during a physical examination for employment. The patient suspects that his  current job may be contributing to elevated blood pressure levels. The patient has a smoking history, which includes both cigarettes and marijuana, and admits to occasional alcohol consumption, usually on weekends.  The patient has a family history of eczema, with multiple family members affected, including his children. The patient also reports a recent episode of neck pain, which he attributes to poor quality pillows. The patient is currently working multiple jobs and has expressed a desire to undergo routine health screenings, including a colonoscopy and blood work.      Past Medical History:  Diagnosis Date   Eczema     Outpatient Medications Prior to Visit  Medication Sig   hydrOXYzine (VISTARIL) 25 MG capsule hydroxyzine pamoate 25 mg capsule   [DISCONTINUED] hydrOXYzine (ATARAX/VISTARIL) 25 MG tablet Take 50 mg by mouth at bedtime.   [DISCONTINUED] tacrolimus (PROTOPIC) 0.03 % ointment Protopic 0.03 % topical ointment  APPLY A THIN LAYER TO THE AFFECTED AREA(S) AS NEEDED.   [DISCONTINUED] triamcinolone cream (KENALOG) 0.5 % Apply topically 2 (two) times daily.   [DISCONTINUED] ondansetron (ZOFRAN-ODT) 4 MG disintegrating tablet Take 1 tablet (4 mg total) by mouth every 8 (eight) hours as needed for nausea or vomiting.   No facility-administered medications prior to visit.    History reviewed. No pertinent surgical history. Family Status  Relation Name Status   Mother  Alive   Father  Alive  No partnership data on file   Family History  Problem Relation Age of Onset   Hypertension Father    Arthritis Father    Social History   Socioeconomic History  Marital status: Married    Spouse name: Not on file   Number of children: Not on file   Years of education: Not on file   Highest education level: Not on file  Occupational History   Not on file  Tobacco Use   Smoking status: Every Day    Current packs/day: 0.50    Types: Cigarettes   Smokeless tobacco: Never   Substance and Sexual Activity   Alcohol use: Yes    Alcohol/week: 3.0 standard drinks of alcohol    Types: 3 Shots of liquor per week    Comment: drink 3 a week   Drug use: Yes    Types: Marijuana   Sexual activity: Yes  Other Topics Concern   Not on file  Social History Narrative   Not on file   Social Drivers of Health   Financial Resource Strain: Not on file  Food Insecurity: Not on file  Transportation Needs: Not on file  Physical Activity: Not on file  Stress: Not on file  Social Connections: Unknown (07/06/2021)   [facility]   Social Network    Social Network: Not on file     No Known Allergies  Immunization History  Administered Date(s) Administered   Influenza, Seasonal, Injecte, Preservative Fre 03/14/2023    Health Maintenance  Topic Date Due   Pneumococcal Vaccine 40-56 Years old (1 of 2 - PCV) Never done   HIV Screening  Never done   Hepatitis C Screening  Never done   DTaP/Tdap/Td (1 - Tdap) Never done   Colonoscopy  Never done   COVID-19 Vaccine (1 - 2024-25 season) Never done   Zoster Vaccines- Shingrix (1 of 2) Never done   INFLUENZA VACCINE  Completed   HPV VACCINES  Aged Out    Patient Care Team: Ronnald Ramp, MD as PCP - General (Family Medicine)  Review of Systems  Last CBC Lab Results  Component Value Date   WBC 10.8 (H) 01/11/2022   HGB 14.0 01/11/2022   HCT 43.0 01/11/2022   MCV 79.8 (L) 01/11/2022   MCH 26.0 01/11/2022   RDW 14.5 01/11/2022   PLT 414 (H) 01/11/2022        Objective    BP 127/85 (BP Location: Right Arm, Patient Position: Sitting, Cuff Size: Normal)   Pulse 64   Ht 5\' 6"  (1.676 m)   Wt 198 lb (89.8 kg)   SpO2 98%   BMI 31.96 kg/m   BP Readings from Last 3 Encounters:  03/14/23 127/85  01/26/23 (!) 132/95  01/11/22 125/79   Wt Readings from Last 3 Encounters:  03/14/23 198 lb (89.8 kg)  01/26/23 190 lb 0.6 oz (86.2 kg)  01/11/22 190 lb (86.2 kg)         Depression Screen    03/14/2023    2:10 PM  PHQ 2/9 Scores  PHQ - 2 Score 0  PHQ- 9 Score 2   No results found for any visits on 03/14/23.   Physical Exam General: Alert, no acute distress Cardio: Normal S1 and S2, RRR, no r/m/g Pulm: CTAB, normal work of breathing          Assessment & Plan      Problem List Items Addressed This Visit       Cardiovascular and Mediastinum   Primary hypertension   Obesity associated w/ htn Elevated diastolic blood pressure (85 mmHg) noted during the visit. No current antihypertensive medications. Discussed lifestyle modifications including reducing sodium and  alcohol intake, and smoking cessation to manage blood pressure. Explained cardiovascular risks of unmanaged hypertension. - Recheck blood pressure - Advise lifestyle modifications including reducing sodium and alcohol intake - Encourage smoking cessation - Order lipid panel      Migraine without aura and without status migrainosus, not intractable   Recurrent headaches triggered by strong smells, localized to the right side, associated with visual disturbances and nausea. Occurs approximately once a month. Discussed over-the-counter medications and magnesium supplementation for symptom relief. Considered FMLA for work-related triggers if headaches persist. - Advise avoidance of known triggers - Recommend over-the-counter medications (Tylenol, Excedrin Migraine) - Discuss potential use of magnesium supplementation - Consider FMLA for work-related triggers if headaches persist      Relevant Orders   CBC   TSH+T4F+T3Free   Magnesium     Musculoskeletal and Integument   Eczema - Primary   Chronic eczema with severe flares, currently managed with avoidance of irritants. Not using current medications due to side effects and cost. Noted large patches and bumps on physical exam. Discussed potential use of tacrolimus ointment and triamcinolone cream. Dermatology referral  necessary for further evaluation, including potential biopsy for folliculitis and epidermal cysts. - Refer to dermatology - Consider biopsy for folliculitis and epidermal cysts - Advise continued use of Dove sensitive skin products - Discuss potential use of tacrolimus ointment and triamcinolone cream        Other   Vertigo   Intermittent dizziness, particularly in the mornings, possibly related to high sodium intake and alcohol consumption. No recent episodes since last week. Differential includes anemia, thyroid dysfunction, and visual disturbances. Discussed reducing sodium and alcohol intake. - Order metabolic panel - Order CBC - Order thyroid studies (TSH, T4, T3) - Order vitamin B12 level - Advise scheduling an eye exam with an optometrist      Relevant Orders   CBC   Hemoglobin A1c   TSH+T4F+T3Free   Vitamin B12   CMP14+EGFR   Magnesium   Class 1 obesity due to excess calories with body mass index (BMI) of 31.0 to 31.9 in adult   Relevant Orders   Lipid Profile   Other Visit Diagnoses       Screening for colon cancer       Relevant Orders   Ambulatory referral to Gastroenterology     Screening for HIV (human immunodeficiency virus)       Relevant Orders   HIV Antibody (routine testing w rflx)     Encounter for hepatitis C screening test for low risk patient       Relevant Orders   Hepatitis C antibody     Encounter for immunization       Relevant Orders   Flu vaccine trivalent PF, 6mos and older(Flulaval,Afluria,Fluarix,Fluzone) (Completed)             General Health Maintenance 53 years old, requires routine health maintenance. No recent blood work in the past two years. No history of shingles vaccination. Discussed importance of regular screenings and vaccinations. - Order blood work including hepatitis C, HIV, and diabetes screening - Refer for colonoscopy - Administer shingles vaccine - Encourage signing up for MyChart  Follow-up - Schedule  follow-up appointment in four months for physical exam - Advise follow-up sooner if lab results indicate abnormalities - Instruct to contact the office if no feedback on referrals within ten business days.          Return in about 4 months (around 07/12/2023) for CPE.  Ronnald Ramp, MD  [facility] [phone] (phone) [phone] (fax)  [facility]

## 2023-03-14 NOTE — Patient Instructions (Signed)
VISIT SUMMARY:  During today's visit, we discussed your intermittent dizziness, headaches, eczema, and high blood pressure. We also reviewed your general health maintenance needs, including routine screenings and vaccinations.  YOUR PLAN:  -VERTIGO: Vertigo is a sensation of dizziness or spinning. We suspect your dizziness may be related to high sodium intake and alcohol consumption. We have ordered a metabolic panel, complete blood count, thyroid studies, and vitamin B12 level to investigate further. Please also schedule an eye exam with an optometrist.  -MIGRAINE HEADACHES: Migraines are severe headaches often accompanied by visual disturbances and nausea. Your headaches seem to be triggered by strong smells. We recommend avoiding known triggers, using over-the-counter medications like Tylenol or Excedrin Migraine, and considering magnesium supplements. If your headaches persist, we may explore work-related triggers and consider FMLA.  -HYPERTENSION: Hypertension is high blood pressure, which can increase the risk of heart disease. Your blood pressure was slightly elevated today. We recommend lifestyle changes such as reducing sodium and alcohol intake and quitting smoking. We have ordered a lipid panel to check your cholesterol levels.  -ECZEMA: Eczema is a skin condition that causes itchy and inflamed patches of skin. Your eczema has flared up recently. We suggest continuing to use Dove sensitive skin products and considering tacrolimus ointment and triamcinolone cream. We will refer you to a dermatologist for further evaluation and possible biopsy.  -GENERAL HEALTH MAINTENANCE: Routine health maintenance is important for overall well-being. We have ordered blood work, including screenings for hepatitis C, HIV, and diabetes. We also recommend a colonoscopy and administering the shingles vaccine. Please sign up for MyChart to keep track of your health records.  INSTRUCTIONS:  Please schedule a  follow-up appointment in four months for a physical exam. Follow up sooner if your lab results indicate any abnormalities. Contact our office if you do not receive feedback on your referrals within ten business days.

## 2023-03-14 NOTE — Assessment & Plan Note (Signed)
Intermittent dizziness, particularly in the mornings, possibly related to high sodium intake and alcohol consumption. No recent episodes since last week. Differential includes anemia, thyroid dysfunction, and visual disturbances. Discussed reducing sodium and alcohol intake. - Order metabolic panel - Order CBC - Order thyroid studies (TSH, T4, T3) - Order vitamin B12 level - Advise scheduling an eye exam with an optometrist

## 2023-03-15 ENCOUNTER — Telehealth: Payer: Self-pay

## 2023-03-15 ENCOUNTER — Other Ambulatory Visit: Payer: Self-pay

## 2023-03-15 DIAGNOSIS — Z1211 Encounter for screening for malignant neoplasm of colon: Secondary | ICD-10-CM

## 2023-03-15 LAB — CMP14+EGFR
ALT: 43 [IU]/L (ref 0–44)
AST: 27 [IU]/L (ref 0–40)
Albumin: 4.2 g/dL (ref 4.1–5.1)
Alkaline Phosphatase: 97 [IU]/L (ref 44–121)
BUN/Creatinine Ratio: 14 (ref 9–20)
BUN: 11 mg/dL (ref 6–24)
Bilirubin Total: 0.2 mg/dL (ref 0.0–1.2)
CO2: 24 mmol/L (ref 20–29)
Calcium: 9.1 mg/dL (ref 8.7–10.2)
Chloride: 102 mmol/L (ref 96–106)
Creatinine, Ser: 0.81 mg/dL (ref 0.76–1.27)
Globulin, Total: 3.1 g/dL (ref 1.5–4.5)
Glucose: 79 mg/dL (ref 70–99)
Potassium: 3.9 mmol/L (ref 3.5–5.2)
Sodium: 139 mmol/L (ref 134–144)
Total Protein: 7.3 g/dL (ref 6.0–8.5)
eGFR: 107 mL/min/{1.73_m2} (ref >59)

## 2023-03-15 LAB — CBC
Hematocrit: 45.3 % (ref 37.5–51.0)
Hemoglobin: 13.7 g/dL (ref 13.0–17.7)
MCH: 26.1 pg — ABNORMAL LOW (ref 26.6–33.0)
MCHC: 30.2 g/dL — ABNORMAL LOW (ref 31.5–35.7)
MCV: 87 fL (ref 79–97)
Platelets: 438 10*3/uL (ref 150–450)
RBC: 5.24 x10E6/uL (ref 4.14–5.80)
RDW: 13.6 % (ref 11.6–15.4)
WBC: 13.4 10*3/uL — ABNORMAL HIGH (ref 3.4–10.8)

## 2023-03-15 LAB — MAGNESIUM: Magnesium: 1.9 mg/dL (ref 1.6–2.3)

## 2023-03-15 LAB — HEMOGLOBIN A1C
Est. average glucose Bld gHb Est-mCnc: 117 mg/dL
Hgb A1c MFr Bld: 5.7 % — ABNORMAL HIGH (ref 4.8–5.6)

## 2023-03-15 LAB — LIPID PANEL
Chol/HDL Ratio: 4.2 {ratio} (ref 0.0–5.0)
Cholesterol, Total: 176 mg/dL (ref 100–199)
HDL: 42 mg/dL (ref >39)
LDL Chol Calc (NIH): 112 mg/dL — ABNORMAL HIGH (ref 0–99)
Triglycerides: 124 mg/dL (ref 0–149)
VLDL Cholesterol Cal: 22 mg/dL (ref 5–40)

## 2023-03-15 LAB — HEPATITIS C ANTIBODY: Hep C Virus Ab: NONREACTIVE

## 2023-03-15 LAB — TSH+T4F+T3FREE
Free T4: 1.21 ng/dL (ref 0.82–1.77)
T3, Free: 3.8 pg/mL (ref 2.0–4.4)
TSH: 1.48 u[IU]/mL (ref 0.450–4.500)

## 2023-03-15 LAB — HIV ANTIBODY (ROUTINE TESTING W REFLEX): HIV Screen 4th Generation wRfx: NONREACTIVE

## 2023-03-15 LAB — VITAMIN B12: Vitamin B-12: 423 pg/mL (ref 232–1245)

## 2023-03-15 MED ORDER — NA SULFATE-K SULFATE-MG SULF 17.5-3.13-1.6 GM/177ML PO SOLN: 1.0000 | Freq: Once | ORAL | 0 refills | Status: AC

## 2023-03-15 NOTE — Telephone Encounter (Signed)
Gastroenterology Pre-Procedure Review  Request Date: 04/13/23 Requesting Physician: Dr. Allegra Lai  PATIENT REVIEW QUESTIONS: The patient responded to the following health history questions as indicated:    1. Are you having any GI issues? no 2. Do you have a personal history of Polyps? no 3. Do you have a family history of Colon Cancer or Polyps? no 4. Diabetes Mellitus? no 5. Joint replacements in the past 12 months?no 6. Major health problems in the past 3 months?no 7. Any artificial heart valves, MVP, or defibrillator?no    MEDICATIONS & ALLERGIES:    Patient reports the following regarding taking any anticoagulation/antiplatelet therapy:   Plavix, Coumadin, Eliquis, Xarelto, Lovenox, Pradaxa, Brilinta, or Effient? no Aspirin? no  Patient confirms/reports the following medications:  Current Outpatient Medications  Medication Sig Dispense Refill   tacrolimus (PROTOPIC) 0.03 % ointment APPLY A THIN LAYER TO THE AFFECTED AREA(S) AS NEEDED.     tacrolimus (PROTOPIC) 0.1 % ointment APPLY A THIN LAYER TO THE AFFECTED AREA(S) AS NEEDED.     triamcinolone ointment (KENALOG) 0.1 % APPLY A THIN LAYER TO THE AFFECTED AREA(S) 2-3x PER DAY, WHEN CONDITION IS SEVERE     hydrOXYzine (VISTARIL) 25 MG capsule hydroxyzine pamoate 25 mg capsule     triamcinolone cream (KENALOG) 0.1 %      triamcinolone cream (KENALOG) 0.5 %      No current facility-administered medications for this visit.    Patient confirms/reports the following allergies:  No Known Allergies  No orders of the defined types were placed in this encounter.   AUTHORIZATION INFORMATION Primary Insurance: 1D#: Group #:  Secondary Insurance: 1D#: Group #:  SCHEDULE INFORMATION: Date:  04/13/23 Time: Location: [facility]

## 2023-03-21 MED ORDER — ROSUVASTATIN CALCIUM 10 MG PO TABS: 10.0000 mg | ORAL_TABLET | Freq: Every day | ORAL | 3 refills | Status: DC

## 2023-03-21 NOTE — Addendum Note (Signed)
Addended by: Bing Neighbors on: 03/21/2023 12:00 PM   Modules accepted: Orders

## 2023-04-06 ENCOUNTER — Encounter: Payer: Self-pay | Admitting: Gastroenterology

## 2023-04-13 ENCOUNTER — Encounter
Admission: RE | Disposition: A | Discharge status: Home / Self Care | Payer: Self-pay | Source: Ambulatory Visit | Attending: Gastroenterology

## 2023-04-13 ENCOUNTER — Ambulatory Visit: Payer: 59 | Admitting: Anesthesiology

## 2023-04-13 ENCOUNTER — Ambulatory Visit
Admission: RE | Admit: 2023-04-13 | Discharge: 2023-04-13 | Disposition: A | Discharge status: Home / Self Care | Payer: 59 | Source: Ambulatory Visit | Attending: Gastroenterology | Admitting: Gastroenterology

## 2023-04-13 ENCOUNTER — Encounter: Payer: Self-pay | Admitting: Gastroenterology

## 2023-04-13 DIAGNOSIS — K644 Residual hemorrhoidal skin tags: Secondary | ICD-10-CM | POA: Diagnosis not present

## 2023-04-13 DIAGNOSIS — F17210 Nicotine dependence, cigarettes, uncomplicated: Secondary | ICD-10-CM | POA: Insufficient documentation

## 2023-04-13 DIAGNOSIS — K5730 Diverticulosis of large intestine without perforation or abscess without bleeding: Secondary | ICD-10-CM | POA: Diagnosis not present

## 2023-04-13 DIAGNOSIS — D124 Benign neoplasm of descending colon: Secondary | ICD-10-CM | POA: Insufficient documentation

## 2023-04-13 DIAGNOSIS — Z1211 Encounter for screening for malignant neoplasm of colon: Secondary | ICD-10-CM | POA: Diagnosis present

## 2023-04-13 HISTORY — DX: Pure hypercholesterolemia, unspecified: E78.00

## 2023-04-13 HISTORY — PX: POLYPECTOMY: SHX5525

## 2023-04-13 HISTORY — PX: COLONOSCOPY WITH PROPOFOL: SHX5780

## 2023-04-13 SURGERY — COLONOSCOPY WITH PROPOFOL
Anesthesia: General

## 2023-04-13 MED ORDER — SODIUM CHLORIDE 0.9% FLUSH
3.0000 mL | INTRAVENOUS | Status: DC | PRN
Start: 2023-04-13 — End: 2023-04-13

## 2023-04-13 MED ORDER — SODIUM CHLORIDE 0.9% FLUSH: 3.0000 mL | Freq: Two times a day (BID) | INTRAVENOUS | Status: DC

## 2023-04-13 MED ORDER — PROPOFOL 10 MG/ML IV BOLUS
INTRAVENOUS | Status: DC | PRN
  Administered 2023-04-13: 70 mg via INTRAVENOUS

## 2023-04-13 MED ORDER — SODIUM CHLORIDE 0.9 % IV SOLN: INTRAVENOUS | Status: DC | PRN

## 2023-04-13 MED ORDER — PROPOFOL 500 MG/50ML IV EMUL
INTRAVENOUS | Status: DC | PRN
  Administered 2023-04-13: 170 ug/kg/min via INTRAVENOUS

## 2023-04-13 NOTE — Anesthesia Postprocedure Evaluation (Signed)
 Anesthesia Post Note  Patient: Donald Benitez  Procedure(s) Performed: COLONOSCOPY WITH PROPOFOL POLYPECTOMY  Patient location during evaluation: Endoscopy Anesthesia Type: General Level of consciousness: awake and alert Pain management: pain level controlled Vital Signs Assessment: post-procedure vital signs reviewed and stable Respiratory status: spontaneous breathing, nonlabored ventilation, respiratory function stable and patient connected to nasal cannula oxygen Cardiovascular status: blood pressure returned to baseline and stable Postop Assessment: no apparent nausea or vomiting Anesthetic complications: no   No notable events documented.   Last Vitals:  Vitals:   04/13/23 0952 04/13/23 1012  BP: 116/81 (!) 130/90  Pulse: 86   Resp: 13   Temp: 36.4 C   SpO2: 98%     Last Pain:  Vitals:   04/13/23 1012  TempSrc:   PainSc: 0-No pain                 Louie Boston

## 2023-04-13 NOTE — Transfer of Care (Signed)
 Immediate Anesthesia Transfer of Care Note  Patient: Donald Benitez  Procedure(s) Performed: COLONOSCOPY WITH PROPOFOL POLYPECTOMY  Patient Location: PACU  Anesthesia Type:General  Level of Consciousness: drowsy  Airway & Oxygen Therapy: Patient Spontanous Breathing  Post-op Assessment: Report given to RN and Post -op Vital signs reviewed and stable  Post vital signs: Reviewed and stable  Last Vitals:  Vitals Value Taken Time  BP 116/81 04/13/23 0953  Temp    Pulse 83 04/13/23 0953  Resp 14 04/13/23 0953  SpO2 98 % 04/13/23 0953  Vitals shown include unfiled device data.  Last Pain:  Vitals:   04/13/23 0817  TempSrc: Temporal  PainSc: 0-No pain         Complications: No notable events documented.

## 2023-04-13 NOTE — Op Note (Signed)
 [facility] Patient Name: Donald Benitez Procedure Date: 04/13/2023 9:19 AM MRN: [id number] Account #: [id number] Date of Birth: [birth year] Admit Type: Outpatient Age: 53 Room: [facility] ENDO ROOM 3 Gender: Male Note Status: Finalized Instrument Name: Prentice Docker [id number] Procedure:             Colonoscopy Indications:           Screening for colorectal malignant neoplasm, This is                         the patient's first colonoscopy Providers:             Toney Reil MD, MD Referring MD:          Nicki Guadalajara (Referring MD) Medicines:             General Anesthesia Complications:         No immediate complications. Estimated blood loss: None. Procedure:             Pre-Anesthesia Assessment:                        - Prior to the procedure, a History and Physical was                         performed, and patient medications and allergies were                         reviewed. The patient is competent. The risks and                         benefits of the procedure and the sedation options and                         risks were discussed with the patient. All questions                         were answered and informed consent was obtained.                         Patient identification and proposed procedure were                         verified by the physician, the nurse, the                         anesthesiologist, the anesthetist and the technician                         in the pre-procedure area in the procedure room in the                         endoscopy suite. Mental Status Examination: alert and                         oriented. Airway Examination: normal oropharyngeal                         airway and neck mobility. Respiratory Examination:  clear to auscultation. CV Examination: normal.                         Prophylactic Antibiotics: The patient does not require                         prophylactic  antibiotics. Prior Anticoagulants: The                         patient has taken no anticoagulant or antiplatelet                         agents. ASA Grade Assessment: II - A patient with mild                         systemic disease. After reviewing the risks and                         benefits, the patient was deemed in satisfactory                         condition to undergo the procedure. The anesthesia                         plan was to use general anesthesia. Immediately prior                         to administration of medications, the patient was                         re-assessed for adequacy to receive sedatives. The                         heart rate, respiratory rate, oxygen saturations,                         blood pressure, adequacy of pulmonary ventilation, and                         response to care were monitored throughout the                         procedure. The physical status of the patient was                         re-assessed after the procedure.                        After obtaining informed consent, the colonoscope was                         passed under direct vision. Throughout the procedure,                         the patient's blood pressure, pulse, and oxygen                         saturations were monitored continuously. The  Colonoscope was introduced through the anus and                         advanced to the the terminal ileum, with                         identification of the appendiceal orifice and IC                         valve. The colonoscopy was performed without                         difficulty. The patient tolerated the procedure well.                         The quality of the bowel preparation was evaluated                         using the BBPS [facility] Bowel Preparation Scale) with                         scores of: Right Colon = 3, Transverse Colon = 3 and                         Left Colon = 3 (entire  mucosa seen well with no                         residual staining, small fragments of stool or opaque                         liquid). The total BBPS score equals 9. The ileocecal                         valve, appendiceal orifice, and rectum were                         photographed. Findings:      The perianal and digital rectal examinations were normal. Pertinent       negatives include normal sphincter tone and no palpable rectal lesions.      A 5 mm polyp was found in the descending colon. The polyp was sessile.       The polyp was removed with a cold snare. Resection and retrieval were       complete.      A few small-mouthed diverticula were found in the sigmoid colon and       descending colon.      Non-bleeding external hemorrhoids were found during retroflexion. The       hemorrhoids were medium-sized.      The terminal ileum appeared normal. Impression:            - One 5 mm polyp in the descending colon, removed with                         a cold snare. Resected and retrieved.                        - Diverticulosis in the sigmoid colon and in the  descending colon.                        - Non-bleeding external hemorrhoids.                        - The examined portion of the ileum was normal. Recommendation:        - Discharge patient to home (with escort).                        - Resume previous diet today.                        - Continue present medications.                        - Await pathology results.                        - Repeat colonoscopy in 5 to 7 years for surveillance                         based on pathology results. Procedure Code(s):     --- Professional ---                        [phone], Colonoscopy, flexible; with removal of                         tumor(s), polyp(s), or other lesion(s) by snare                         technique Diagnosis Code(s):     --- Professional ---                        Z12.11, Encounter for  screening for malignant neoplasm                         of colon                        D12.4, Benign neoplasm of descending colon                        K64.4, Residual hemorrhoidal skin tags                        K57.30, Diverticulosis of large intestine without                         perforation or abscess without bleeding CPT copyright 2022 American Medical Association. All rights reserved. The codes documented in this report are preliminary and upon coder review may  be revised to meet current compliance requirements. Dr. Libby Maw Toney Reil MD, MD 04/13/2023 9:51:21 AM This report has been signed electronically. Number of Addenda: 0 Note Initiated On: 04/13/2023 9:19 AM Scope Withdrawal Time: 0 hours 10 minutes 46 seconds  Total Procedure Duration: 0 hours 16 minutes 7 seconds  Estimated Blood Loss:  Estimated blood loss: none. Estimated blood loss: none.      [facility]

## 2023-04-13 NOTE — H&P (Signed)
 Arlyss Repress, MD [address]  [address]  [place], [place] [address]  Main: [phone]  Fax: [phone] Pager: [phone]  Primary Care Physician:  Ronnald Ramp, MD Primary Gastroenterologist:  Dr. Arlyss Repress  Pre-Procedure History & Physical: HPI:  Donald Benitez is a 53 y.o. male is here for an colonoscopy.   Past Medical History:  Diagnosis Date   Eczema    Hypercholesteremia     Past Surgical History:  Procedure Laterality Date   HAND TENDON SURGERY Left    2010    Prior to Admission medications   Medication Sig Start Date End Date Taking? Authorizing Provider  rosuvastatin (CRESTOR) 10 MG tablet Take 1 tablet (10 mg total) by mouth daily. 03/21/23  Yes Simmons-Robinson, Makiera, MD  hydrOXYzine (VISTARIL) 25 MG capsule hydroxyzine pamoate 25 mg capsule    [provider]  tacrolimus (PROTOPIC) 0.03 % ointment APPLY A THIN LAYER TO THE AFFECTED AREA(S) AS NEEDED. 04/03/18   [provider]  tacrolimus (PROTOPIC) 0.1 % ointment APPLY A THIN LAYER TO THE AFFECTED AREA(S) AS NEEDED. 06/28/18   [provider]  triamcinolone cream (KENALOG) 0.1 %     [provider]  triamcinolone cream (KENALOG) 0.5 %     [provider]  triamcinolone ointment (KENALOG) 0.1 % APPLY A THIN LAYER TO THE AFFECTED AREA(S) 2-3x PER DAY, WHEN CONDITION IS SEVERE 06/28/18   [provider]    Allergies as of 03/15/2023   (No Known Allergies)    Family History  Problem Relation Age of Onset   Hypertension Father    Arthritis Father     Social History   Socioeconomic History   Marital status: Married    Spouse name: Not on file   Number of children: Not on file   Years of education: Not on file   Highest education level: Not on file  Occupational History   Not on file  Tobacco Use   Smoking status: Every Day    Current packs/day: 0.50    Types: Cigarettes   Smokeless tobacco: Never  Substance and  Sexual Activity   Alcohol use: Yes    Alcohol/week: 3.0 standard drinks of alcohol    Types: 3 Shots of liquor per week    Comment: drink 3 a week   Drug use: Yes    Types: Marijuana   Sexual activity: Yes  Other Topics Concern   Not on file  Social History Narrative   Not on file   Social Drivers of Health   Financial Resource Strain: Not on file  Food Insecurity: Not on file  Transportation Needs: Not on file  Physical Activity: Not on file  Stress: Not on file  Social Connections: Unknown (07/06/2021)   Received from [facility]   Social Network    Social Network: Not on file  Intimate Partner Violence: Unknown (05/28/2021)   Received from [facility]   HITS    Physically Hurt: Not on file    Insult or Talk Down To: Not on file    Threaten Physical Harm: Not on file    Scream or Curse: Not on file    Review of Systems: See HPI, otherwise negative ROS  Physical Exam: BP 132/87   Pulse 84   Temp (!) 97.4 F (36.3 C) (Temporal)   Resp 17   Ht 5\' 6"  (1.676 m)   Wt 86.4 kg   SpO2 100%   BMI 30.73 kg/m  General:   Alert,  pleasant and cooperative in NAD Head:  Normocephalic and atraumatic. Neck:  Supple; no masses or thyromegaly. Lungs:  Clear throughout to auscultation.    Heart:  Regular rate and rhythm. Abdomen:  Soft, nontender and nondistended. Normal bowel sounds, without guarding, and without rebound.   Neurologic:  Alert and  oriented x4;  grossly normal neurologically.  Impression/Plan: Donald Benitez is here for an colonoscopy to be performed for colon cancer screening  Risks, benefits, limitations, and alternatives regarding  colonoscopy have been reviewed with the patient.  Questions have been answered.  All parties agreeable.   Lannette Donath, MD  04/13/2023, 8:55 AM

## 2023-04-13 NOTE — Anesthesia Preprocedure Evaluation (Addendum)
 Anesthesia Evaluation  Patient identified by MRN, date of birth, ID band Patient awake    Reviewed: Allergy & Precautions, NPO status , Patient's Chart, lab work & pertinent test results  History of Anesthesia Complications Negative for: history of anesthetic complications  Airway Mallampati: III  TM Distance: >3 FB Neck ROM: full    Dental no notable dental hx.    Pulmonary Current Smoker and Patient abstained from smoking.   Pulmonary exam normal        Cardiovascular negative cardio ROS Normal cardiovascular exam     Neuro/Psych negative neurological ROS  negative psych ROS   GI/Hepatic negative GI ROS, Neg liver ROS,,,  Endo/Other  negative endocrine ROS    Renal/GU negative Renal ROS  negative genitourinary   Musculoskeletal   Abdominal   Peds  Hematology negative hematology ROS (+)   Anesthesia Other Findings Past Medical History: No date: Eczema No date: Hypercholesteremia  Past Surgical History: No date: HAND TENDON SURGERY; Left     Comment:  2010     Reproductive/Obstetrics negative OB ROS                              Anesthesia Physical Anesthesia Plan  ASA: 2  Anesthesia Plan: General   Post-op Pain Management: Minimal or no pain anticipated   Induction: Intravenous  PONV Risk Score and Plan: 1 and Propofol infusion and TIVA  Airway Management Planned: Natural Airway and Nasal Cannula  Additional Equipment:   Intra-op Plan:   Post-operative Plan:   Informed Consent: I have reviewed the patients History and Physical, chart, labs and discussed the procedure including the risks, benefits and alternatives for the proposed anesthesia with the patient or authorized representative who has indicated his/her understanding and acceptance.     Dental Advisory Given  Plan Discussed with: Anesthesiologist, CRNA and Surgeon  Anesthesia Plan Comments: (Patient  consented for risks of anesthesia including but not limited to:  - adverse reactions to medications - risk of airway placement if required - damage to eyes, teeth, lips or other oral mucosa - nerve damage due to positioning  - sore throat or hoarseness - Damage to heart, brain, nerves, lungs, other parts of body or loss of life  Patient voiced understanding and assent.)        Anesthesia Quick Evaluation

## 2023-04-13 NOTE — Anesthesia Procedure Notes (Signed)
 Date/Time: 04/13/2023 9:28 AM  Performed by: Malva Cogan, CRNAPre-anesthesia Checklist: Patient identified, Emergency Drugs available, Suction available, Patient being monitored and Timeout performed Patient Re-evaluated:Patient Re-evaluated prior to induction Oxygen Delivery Method: Nasal cannula Induction Type: IV induction Placement Confirmation: CO2 detector and positive ETCO2

## 2023-04-14 ENCOUNTER — Encounter: Payer: Self-pay | Admitting: Gastroenterology

## 2023-04-14 LAB — SURGICAL PATHOLOGY

## 2023-04-15 ENCOUNTER — Encounter: Payer: Self-pay | Admitting: Gastroenterology

## 2023-07-11 ENCOUNTER — Other Ambulatory Visit: Payer: Self-pay

## 2023-07-11 ENCOUNTER — Encounter: Payer: Self-pay | Admitting: Emergency Medicine

## 2023-07-11 DIAGNOSIS — R519 Headache, unspecified: Secondary | ICD-10-CM | POA: Diagnosis present

## 2023-07-11 DIAGNOSIS — G43109 Migraine with aura, not intractable, without status migrainosus: Secondary | ICD-10-CM | POA: Insufficient documentation

## 2023-07-11 NOTE — ED Triage Notes (Signed)
 Patient ambulatory to triage with steady gait, without difficulty or distress noted; pt reports rt frontal HA since 8pm; denies any accomp symptoms; st hx of same; 2-500mg  tylenol PTA without relief

## 2023-07-12 ENCOUNTER — Emergency Department

## 2023-07-12 ENCOUNTER — Emergency Department
Admission: EM | Admit: 2023-07-12 | Discharge: 2023-07-12 | Disposition: A | Discharge status: Home / Self Care | Attending: Emergency Medicine | Admitting: Emergency Medicine

## 2023-07-12 DIAGNOSIS — G43109 Migraine with aura, not intractable, without status migrainosus: Secondary | ICD-10-CM

## 2023-07-12 MED ORDER — PROCHLORPERAZINE EDISYLATE 10 MG/2ML IJ SOLN
10.0000 mg | Freq: Once | INTRAMUSCULAR | Status: AC
  Administered 2023-07-12: 10 mg via INTRAVENOUS
  Filled 2023-07-12: qty 2

## 2023-07-12 MED ORDER — KETOROLAC TROMETHAMINE 15 MG/ML IJ SOLN
15.0000 mg | Freq: Once | INTRAMUSCULAR | Status: AC
  Administered 2023-07-12: 15 mg via INTRAVENOUS
  Filled 2023-07-12: qty 1

## 2023-07-12 MED ORDER — DIPHENHYDRAMINE HCL 50 MG/ML IJ SOLN
25.0000 mg | Freq: Once | INTRAMUSCULAR | Status: AC
  Administered 2023-07-12: 25 mg via INTRAVENOUS
  Filled 2023-07-12: qty 1

## 2023-07-12 MED ORDER — SODIUM CHLORIDE 0.9 % IV BOLUS
500.0000 mL | Freq: Once | INTRAVENOUS | Status: AC
  Administered 2023-07-12: 500 mL via INTRAVENOUS

## 2023-07-12 MED ORDER — ACETAMINOPHEN 325 MG PO TABS
650.0000 mg | ORAL_TABLET | Freq: Once | ORAL | Status: AC
Start: 2023-07-12 — End: 2023-07-12
  Administered 2023-07-12: 650 mg via ORAL
  Filled 2023-07-12: qty 2

## 2023-07-12 NOTE — Discharge Instructions (Signed)
 Talk to your primary doctor about your migraine headaches and discuss medical options including preventative medications and abortive medications for when your headaches get bad.  Take acetaminophen 650 mg and ibuprofen 400 mg every 6 hours for pain.  Take with food.   Thank you for choosing us  for your health care today!  Please see your primary doctor this week for a follow up appointment.   If you have any new, worsening, or unexpected symptoms call your doctor right away or come back to the emergency department for reevaluation.  It was my pleasure to care for you today.   Arron Large Margery Sheets, MD

## 2023-07-12 NOTE — ED Provider Notes (Addendum)
 [facility] Provider Note    None    (approximate)   History   Headache   HPI  Donald Benitez is a 53 y.o. male   Past medical history of high-grade headaches who presents emergency department with migraine headache.  He has intermittent right sided headaches that is exacerbated by salty food intake, stress, low sleep.  He comes with an aura of some visual changes to the right visual field, as it is has in the past.  He ate some salty chicken Windex this evening and then had a visual aura reminiscent of his migraine headache or in the past and then developed a right sided headache.  There is no trauma involved.  He had nausea and vomiting which is typical of his migraines.  Gradual onset but progressed to become quite severe.  At its worst was 10 out of 10, took extra strength Tylenol and notes come down to 7.  Independent Historian contributed to assessment above: Wife at bedside corroborates information past medical history above  External Medical Documents Reviewed: Hospital visits and 2023 with right sided headache thought to be migraine      Physical Exam   Triage Vital Signs: ED Triage Vitals  Encounter Vitals Group     BP 07/12/23 0005 (!) 140/89     Systolic BP Percentile --      Diastolic BP Percentile --      Pulse Rate 07/12/23 0005 62     Resp 07/12/23 0005 20     Temp 07/12/23 0005 97.9 F (36.6 C)     Temp Source 07/12/23 0005 Oral     SpO2 07/12/23 0005 100 %     Weight 07/11/23 2355 190 lb (86.2 kg)     Height 07/11/23 2355 5\' 6"  (1.676 m)     Head Circumference --      Peak Flow --      Pain Score 07/11/23 2354 10     Pain Loc --      Pain Education --      Exclude from Growth Chart --     Most recent vital signs: Vitals:   07/12/23 0005 07/12/23 0427  BP: (!) 140/89 (!) 142/86  Pulse: 62 64  Resp: 20 18  Temp: 97.9 F (36.6 C) 98.1 F (36.7 C)  SpO2: 100% 99%    General: Awake, no distress.  CV:  Good  peripheral perfusion.  Resp:  Normal effort.  Abd:  No distention.  Other:  Awake alert comfortable appearing slightly hypertensive otherwise vital signs are normal.  No temporal tenderness.  No facial asymmetry dysarthria or motor sensory changes on examination.  Pupils equal round and reactive extraocular movements intact.   ED Results / Procedures / Treatments   Labs (all labs ordered are listed, but only abnormal results are displayed) Labs Reviewed - No data to display  RADIOLOGY I independently reviewed and interpreted CT scan of the head see no obvious bleeding or midline shift I also reviewed radiologist's formal read.   PROCEDURES:  Critical Care performed: No  Procedures   MEDICATIONS ORDERED IN ED: Medications  prochlorperazine (COMPAZINE) injection 10 mg (10 mg Intravenous Given 07/12/23 0423)  diphenhydrAMINE (BENADRYL) injection 25 mg (25 mg Intravenous Given 07/12/23 0424)  ketorolac  (TORADOL ) 15 MG/ML injection 15 mg (15 mg Intravenous Given 07/12/23 0423)  acetaminophen (TYLENOL) tablet 650 mg (650 mg Oral Given 07/12/23 0426)  sodium chloride  0.9 % bolus 500 mL (500 mLs Intravenous New Bag/Given  07/12/23 0422)     IMPRESSION / MDM / ASSESSMENT AND PLAN / ED COURSE  I reviewed the triage vital signs and the nursing notes.                                Patient's presentation is most consistent with acute presentation with potential threat to life or bodily function.  Differential diagnosis includes, but is not limited to, migraine headache, ICH or CVA or temporal arteritis considered but less likely, doubt CNS infection   The patient is on the cardiac monitor to evaluate for evidence of arrhythmia and/or significant heart rate changes.  MDM:    Longstanding history of symptoms consistent with migraine headache.  The same tonight.  CT head negative for ICH, and low suspicion given no trauma, no thunderclap headache.  Will give migraine cocktail.  I will  have him follow-up with his PMD for reevaluation and guidance on preventative/abortive medications.   Much improved after medications, safe for discharge, return precautions given      FINAL CLINICAL IMPRESSION(S) / ED DIAGNOSES   Final diagnoses:  Migraine with aura and without status migrainosus, not intractable     Rx / DC Orders   ED Discharge Orders     None        Note:  This document was prepared using Dragon voice recognition software and may include unintentional dictation errors.    Buell Carmin, MD 07/12/23 2956    Buell Carmin, MD 07/12/23 [phone]

## 2023-07-18 ENCOUNTER — Ambulatory Visit
Admission: EM | Admit: 2023-07-18 | Discharge: 2023-07-18 | Disposition: A | Discharge status: Home / Self Care | Attending: Emergency Medicine | Admitting: Emergency Medicine

## 2023-07-18 ENCOUNTER — Ambulatory Visit: Payer: Self-pay

## 2023-07-18 DIAGNOSIS — R109 Unspecified abdominal pain: Secondary | ICD-10-CM | POA: Diagnosis not present

## 2023-07-18 DIAGNOSIS — R3129 Other microscopic hematuria: Secondary | ICD-10-CM | POA: Diagnosis not present

## 2023-07-18 LAB — POCT URINALYSIS DIP (MANUAL ENTRY)
Bilirubin, UA: NEGATIVE
Glucose, UA: NEGATIVE mg/dL
Ketones, POC UA: NEGATIVE mg/dL
Leukocytes, UA: NEGATIVE
Nitrite, UA: NEGATIVE
Protein Ur, POC: NEGATIVE mg/dL
Spec Grav, UA: 1.02 (ref 1.010–1.025)
Urobilinogen, UA: 0.2 U/dL
pH, UA: 7 (ref 5.0–8.0)

## 2023-07-18 NOTE — Telephone Encounter (Signed)
  Chief Complaint: right flank pain Symptoms: right flank pain Frequency: x today Pertinent Negatives: Patient denies urinary frequency, burning with urination, blood in urine, fever, nausea, vomiting Disposition: [] ED /[x] Urgent Care (no appt availability in office) / [] Appointment(In office/virtual)/ []  [place]/ [] Home Care/ [] Refused Recommended Disposition /[] [place] [facility]/ []  Follow-up with PCP Additional Notes: Patient verbalizes understanding to go to urgent care for right flank pain. Patient requesting to set up a hospital follow up regarding his recent ED visit for migraines and elevated BP. Advised him no availability with PCP til July, will need call back from clinic for further advice.  Copied from CRM [id number]. Topic: Clinical - Red Word Triage >> Jul 18, 2023  3:15 PM Donald Benitez wrote: Red Word that prompted transfer to Nurse Triage: Severe pain in side, also has migraines. Reason for Disposition  MODERATE pain (e.g., interferes with normal activities or awakens from sleep)  Answer Assessment - Initial Assessment Questions 1. LOCATION: "Where does it hurt?" (e.g., left, right)     Right.  2. ONSET: "When did the pain start?"     This morning, woke up with it.  3. SEVERITY: "How bad is the pain?" (e.g., Scale 1-10; mild, moderate, or severe)   - MILD (1-3): doesn't interfere with normal activities    - MODERATE (4-7): interferes with normal activities or awakens from sleep    - SEVERE (8-10): excruciating pain and patient unable to do normal activities (stays in bed)       7/10. States he took 2 ibuprofen around 1000.  4. PATTERN: "Does the pain come and go, or is it constant?"      Constant.  5. CAUSE: "What do you think is causing the pain?"     Unsure, states he would like to get his urine tested.  6. OTHER SYMPTOMS:  "Do you have any other symptoms?" (e.g., fever, abdomen pain, vomiting, leg weakness, burning with urination, blood in urine)      Migraine (states he was seen in ED for this and was told to follow up with his PCP for this and his elevated BP)  7. PREGNANCY:  "Is there any chance you are pregnant?" "When was your last menstrual period?"     N/A.  Protocols used: Flank Pain-A-AH

## 2023-07-18 NOTE — ED Triage Notes (Addendum)
 Patient to Urgent Care with complaints of right sided flank pain.  Symptoms started this morning when he woke up.   No hx of kidney stones. Denies any urinary symptoms. Taking ibuprofen.

## 2023-07-18 NOTE — ED Provider Notes (Signed)
 [facility]    CSN: [id number] Arrival date & time: 07/18/23  1614      History   Chief Complaint Chief Complaint  Patient presents with   Flank Pain    HPI BRYCEN BEAN is a 53 y.o. male.  Patient presents with right flank pain since this morning.  The pain is worse with movement and deep breaths.  He reports mild sensation of shortness of breath due to the discomfort that he feels with deep breathing.  He states the pain has improved over the course of the day.  He took ibuprofen early this morning but none since.  No falls or injury.  No rash, lesions, bruising, fever, chest pain, cough, abdominal pain, vomiting, dysuria, hematuria.  Patient states he had similar pain a couple of weeks ago which resolved with increased water intake.  He denies history of kidney stones.  The history is provided by the patient and medical records.    Past Medical History:  Diagnosis Date   Eczema    Hypercholesteremia     Patient Active Problem List   Diagnosis Date Noted   Eczema 03/14/2023   Migraine without aura and without status migrainosus, not intractable 03/14/2023   Class 1 obesity due to excess calories with body mass index (BMI) of 31.0 to 31.9 in adult 03/14/2023   Vertigo 03/14/2023   Primary hypertension 03/14/2023    Past Surgical History:  Procedure Laterality Date   COLONOSCOPY WITH PROPOFOL  N/A 04/13/2023   Procedure: COLONOSCOPY WITH PROPOFOL ;  Surgeon: Selena Daily, MD;  Location: [facility] ENDOSCOPY;  Service: Gastroenterology;  Laterality: N/A;   HAND TENDON SURGERY Left    2010   POLYPECTOMY  04/13/2023   Procedure: POLYPECTOMY;  Surgeon: Selena Daily, MD;  Location: [facility] ENDOSCOPY;  Service: Gastroenterology;;       Home Medications    Prior to Admission medications   Medication Sig Start Date End Date Taking? Authorizing Provider  hydrOXYzine (VISTARIL) 25 MG capsule hydroxyzine pamoate 25 mg capsule Patient not taking: Reported on  07/18/2023    [provider]  rosuvastatin  (CRESTOR ) 10 MG tablet Take 1 tablet (10 mg total) by mouth daily. Patient not taking: Reported on 07/18/2023 03/21/23   Simmons-Robinson, Judyann Number, MD  tacrolimus (PROTOPIC) 0.03 % ointment APPLY A THIN LAYER TO THE AFFECTED AREA(S) AS NEEDED. 04/03/18   [provider]  tacrolimus (PROTOPIC) 0.1 % ointment APPLY A THIN LAYER TO THE AFFECTED AREA(S) AS NEEDED. 06/28/18   [provider]  triamcinolone cream (KENALOG) 0.1 %     [provider]  triamcinolone cream (KENALOG) 0.5 %     [provider]  triamcinolone ointment (KENALOG) 0.1 % APPLY A THIN LAYER TO THE AFFECTED AREA(S) 2-3x PER DAY, WHEN CONDITION IS SEVERE 06/28/18   [provider]    Family History Family History  Problem Relation Age of Onset   Hypertension Father    Arthritis Father     Social History Social History   Tobacco Use   Smoking status: Every Day    Current packs/day: 0.50    Types: Cigarettes   Smokeless tobacco: Never  Substance Use Topics   Alcohol use: Yes    Alcohol/week: 3.0 standard drinks of alcohol    Types: 3 Shots of liquor per week    Comment: drink 3 a week   Drug use: Yes    Types: Marijuana     Allergies   Patient has no known allergies.  Review of Systems Review of Systems  Constitutional:  Negative for chills and fever.  Respiratory:  Positive for shortness of breath. Negative for cough and wheezing.   Cardiovascular:  Negative for chest pain and palpitations.  Gastrointestinal:  Negative for abdominal pain and vomiting.  Genitourinary:  Positive for flank pain. Negative for dysuria and hematuria.  Skin:  Negative for color change, rash and wound.     Physical Exam Triage Vital Signs ED Triage Vitals  Encounter Vitals Group     BP 07/18/23 1708 127/83     Systolic BP Percentile --      Diastolic BP Percentile --      Pulse Rate 07/18/23 1708 64     Resp 07/18/23 1708 18      Temp 07/18/23 1708 97.7 F (36.5 C)     Temp src --      SpO2 07/18/23 1708 97 %     Weight --      Height --      Head Circumference --      Peak Flow --      Pain Score 07/18/23 1714 7     Pain Loc --      Pain Education --      Exclude from Growth Chart --    No data found.  Updated Vital Signs BP 127/83   Pulse 64   Temp 97.7 F (36.5 C)   Resp 18   SpO2 97%   Visual Acuity Right Eye Distance:   Left Eye Distance:   Bilateral Distance:    Right Eye Near:   Left Eye Near:    Bilateral Near:     Physical Exam Constitutional:      General: He is not in acute distress. HENT:     Mouth/Throat:     Mouth: Mucous membranes are moist.  Cardiovascular:     Rate and Rhythm: Normal rate and regular rhythm.     Heart sounds: Normal heart sounds.  Pulmonary:     Effort: Pulmonary effort is normal. No respiratory distress.     Breath sounds: Normal breath sounds.     Comments: No respiratory distress.  Lungs are clear and O2 sat is 97% on room air. Abdominal:     General: Bowel sounds are normal.     Palpations: Abdomen is soft.     Tenderness: There is no abdominal tenderness. There is no right CVA tenderness, left CVA tenderness, guarding or rebound.     Comments: Abdomen is soft and nontender with good bowel sounds.  No CVAT.  Musculoskeletal:        General: No swelling, tenderness or deformity. Normal range of motion.     Comments: Ribs are nontender.  Skin:    General: Skin is warm and dry.     Findings: No bruising, erythema, lesion or rash.  Neurological:     General: No focal deficit present.     Mental Status: He is alert and oriented to person, place, and time.     Sensory: No sensory deficit.     Motor: No weakness.     Gait: Gait normal.      UC Treatments / Results  Labs (all labs ordered are listed, but only abnormal results are displayed) Labs Reviewed  POCT URINALYSIS DIP (MANUAL ENTRY) - Abnormal; Notable for the following components:       Result Value   Clarity, UA cloudy (*)    Blood, UA trace-intact (*)    All  other components within normal limits    EKG   Radiology No results found.  Procedures Procedures (including critical care time)  Medications Ordered in UC Medications - No data to display  Initial Impression / Assessment and Plan / UC Course  I have reviewed the triage vital signs and the nursing notes.  Pertinent labs & imaging results that were available during my care of the patient were reviewed by me and considered in my medical decision making (see chart for details).   Right flank pain, microscopic hematuria.  Afebrile and vital signs are stable.  Discussed limitations of evaluation of his symptoms in an urgent care setting.  Patient declines transfer to the ED at this time.  His symptoms are suspicious for kidney stones, although he has no history of these.  Instructed him to increase his water intake.  Tylenol as needed for discomfort.  Instructed him to follow-up with his PCP tomorrow.  Strict ED precautions discussed.  Education provided on flank pain and hematuria.  Patient agrees to plan of care.  Final Clinical Impressions(s) / UC Diagnoses   Final diagnoses:  Right flank pain  Other microscopic hematuria     Discharge Instructions      Follow up with your primary care provider tomorrow.  Go to the emergency department if you have worsening symptoms.    Increase your water intake.  Your urine has trace blood but no indication of infection.   ED Prescriptions   None    PDMP not reviewed this encounter.   Wellington Half, NP 07/18/23 [phone]

## 2023-07-18 NOTE — Discharge Instructions (Addendum)
 Follow up with your primary care provider tomorrow.  Go to the emergency department if you have worsening symptoms.    Increase your water intake.  Your urine has trace blood but no indication of infection.

## 2023-07-19 ENCOUNTER — Telehealth: Payer: Self-pay | Admitting: Family Medicine

## 2023-07-19 NOTE — Telephone Encounter (Signed)
 LVM to get hospital f/u scheduled. It's ok for E2C2 to schedule.

## 2023-07-21 ENCOUNTER — Emergency Department

## 2023-07-21 ENCOUNTER — Emergency Department
Admission: EM | Admit: 2023-07-21 | Discharge: 2023-07-21 | Disposition: A | Discharge status: Home / Self Care | Attending: Emergency Medicine | Admitting: Emergency Medicine

## 2023-07-21 ENCOUNTER — Other Ambulatory Visit: Payer: Self-pay

## 2023-07-21 DIAGNOSIS — M546 Pain in thoracic spine: Secondary | ICD-10-CM | POA: Insufficient documentation

## 2023-07-21 DIAGNOSIS — R109 Unspecified abdominal pain: Secondary | ICD-10-CM | POA: Diagnosis present

## 2023-07-21 DIAGNOSIS — D72829 Elevated white blood cell count, unspecified: Secondary | ICD-10-CM | POA: Insufficient documentation

## 2023-07-21 LAB — LIPASE, BLOOD: Lipase: 30 U/L (ref 11–51)

## 2023-07-21 LAB — URINALYSIS, ROUTINE W REFLEX MICROSCOPIC
Bacteria, UA: NONE SEEN
Bilirubin Urine: NEGATIVE
Glucose, UA: NEGATIVE mg/dL
Ketones, ur: NEGATIVE mg/dL
Leukocytes,Ua: NEGATIVE
Nitrite: NEGATIVE
Protein, ur: NEGATIVE mg/dL
Specific Gravity, Urine: 1.027 (ref 1.005–1.030)
Squamous Epithelial / HPF: 0 /HPF (ref 0–5)
pH: 5 (ref 5.0–8.0)

## 2023-07-21 LAB — COMPREHENSIVE METABOLIC PANEL WITH GFR
ALT: 31 U/L (ref 0–44)
AST: 20 U/L (ref 15–41)
Albumin: 4.3 g/dL (ref 3.5–5.0)
Alkaline Phosphatase: 72 U/L (ref 38–126)
Anion gap: 9 (ref 5–15)
BUN: 16 mg/dL (ref 6–20)
CO2: 22 mmol/L (ref 22–32)
Calcium: 8.8 mg/dL — ABNORMAL LOW (ref 8.9–10.3)
Chloride: 108 mmol/L (ref 98–111)
Creatinine, Ser: 0.72 mg/dL (ref 0.61–1.24)
GFR, Estimated: >60 mL/min (ref >60)
Glucose, Bld: 96 mg/dL (ref 70–99)
Potassium: 3.7 mmol/L (ref 3.5–5.1)
Sodium: 139 mmol/L (ref 135–145)
Total Bilirubin: 0.6 mg/dL (ref 0.0–1.2)
Total Protein: 7.8 g/dL (ref 6.5–8.1)

## 2023-07-21 LAB — CBC
HCT: 44 % (ref 39.0–52.0)
Hemoglobin: 14 g/dL (ref 13.0–17.0)
MCH: 26.6 pg (ref 26.0–34.0)
MCHC: 31.8 g/dL (ref 30.0–36.0)
MCV: 83.5 fL (ref 80.0–100.0)
Platelets: 429 10*3/uL — ABNORMAL HIGH (ref 150–400)
RBC: 5.27 MIL/uL (ref 4.22–5.81)
RDW: 14.6 % (ref 11.5–15.5)
WBC: 10.7 10*3/uL — ABNORMAL HIGH (ref 4.0–10.5)
nRBC: 0 % (ref 0.0–0.2)

## 2023-07-21 MED ORDER — CYCLOBENZAPRINE HCL 5 MG PO TABS: 5.0000 mg | ORAL_TABLET | Freq: Three times a day (TID) | ORAL | 0 refills | Status: DC | PRN

## 2023-07-21 MED ORDER — KETOROLAC TROMETHAMINE 60 MG/2ML IM SOLN
60.0000 mg | Freq: Once | INTRAMUSCULAR | Status: AC
  Administered 2023-07-21: 60 mg via INTRAMUSCULAR
  Filled 2023-07-21: qty 2

## 2023-07-21 MED ORDER — CYCLOBENZAPRINE HCL 10 MG PO TABS
10.0000 mg | ORAL_TABLET | Freq: Once | ORAL | Status: AC
  Administered 2023-07-21: 10 mg via ORAL
  Filled 2023-07-21: qty 1

## 2023-07-21 NOTE — ED Notes (Signed)
 See triage note  Presents with right flank pain  States this started on Monday  Became worse couple of days ago  Ambulates well

## 2023-07-21 NOTE — ED Provider Notes (Signed)
 [facility] Provider Note   Event Date/Time   First MD Initiated Contact with Patient 07/21/23 1702     (approximate) History  Flank Pain  HPI Donald Benitez is a 53 y.o. male with a past medical history of eczema and migraines who presents complaining of right flank pain that has been ongoing for the past 3 days with some radiation to the right lower quadrant.  Patient denies any history of kidney stones, urinary symptoms, or family history of similar.  Patient states that this pain is worsened with movements and/or palpation on the right side.  Patient was sent to the emergency department from urgent care after they found microscopic hematuria ROS: Patient currently denies any vision changes, tinnitus, difficulty speaking, facial droop, sore throat, chest pain, shortness of breath, nausea/vomiting/diarrhea, dysuria, or weakness/numbness/paresthesias in any extremity   Physical Exam  Triage Vital Signs: ED Triage Vitals  Encounter Vitals Group     BP 07/21/23 1535 (!) 141/84     Systolic BP Percentile --      Diastolic BP Percentile --      Pulse Rate 07/21/23 1535 75     Resp 07/21/23 1535 17     Temp 07/21/23 1535 98.2 F (36.8 C)     Temp Source 07/21/23 1535 Oral     SpO2 07/21/23 1535 100 %     Weight 07/21/23 1703 189 lb 13.1 oz (86.1 kg)     Height 07/21/23 1703 5\' 6"  (1.676 m)     Head Circumference --      Peak Flow --      Pain Score 07/21/23 1547 5     Pain Loc --      Pain Education --      Exclude from Growth Chart --    Most recent vital signs: Vitals:   07/21/23 1535  BP: (!) 141/84  Pulse: 75  Resp: 17  Temp: 98.2 F (36.8 C)  SpO2: 100%   General: Awake, oriented x4. CV:  Good peripheral perfusion. Resp:  Normal effort. Abd:  No distention. Other:  Middle-aged obese African-American male resting comfortably in no acute distress.  Mild tenderness to palpation over right lower thoracic paraspinal musculature ED Results /  Procedures / Treatments  Labs (all labs ordered are listed, but only abnormal results are displayed) Labs Reviewed  COMPREHENSIVE METABOLIC PANEL WITH GFR - Abnormal; Notable for the following components:      Result Value   Calcium  8.8 (*)    All other components within normal limits  CBC - Abnormal; Notable for the following components:   WBC 10.7 (*)    Platelets 429 (*)    All other components within normal limits  URINALYSIS, ROUTINE W REFLEX MICROSCOPIC - Abnormal; Notable for the following components:   Color, Urine YELLOW (*)    APPearance CLEAR (*)    Hgb urine dipstick MODERATE (*)    All other components within normal limits  LIPASE, BLOOD   RADIOLOGY ED MD interpretation: CT pending - All radiology independently interpreted and agree with radiology assessment Official radiology report(s): No results found. PROCEDURES: Critical Care performed: No Procedures MEDICATIONS ORDERED IN ED: Medications - No data to display IMPRESSION / MDM / ASSESSMENT AND PLAN / ED COURSE  I reviewed the triage vital signs and the nursing notes.  The patient is on the cardiac monitor to evaluate for evidence of arrhythmia and/or significant heart rate changes. Patient's presentation is most consistent with acute presentation with potential threat to life or bodily function. Patient presents for low back/flank pain. Given History and Exam the patient appears to be at low risk for Spinal Cord Compression Syndrome, Vertebral Malignancy/Mets, acute Spinal Fracture, Vertebral Osteomyelitis, Epidural Abscess, Infected or Obstructing Kidney Stone.  Care of this patient will be signed out to the oncoming physician at the end of my shift.  All pertinent patient information conveyed and all questions answered.  All further care and disposition decisions will be made by the oncoming physician.    FINAL CLINICAL IMPRESSION(S) / ED DIAGNOSES   Final diagnoses:  None    Rx / DC Orders   ED Discharge Orders     None      Note:  This document was prepared using Dragon voice recognition software and may include unintentional dictation errors.   Philomina Leon K, MD 07/23/23 [phone]

## 2023-07-21 NOTE — ED Triage Notes (Signed)
 Pt to ED via POV from home. Pt reports right flank pain that has been ongoing for a few days. Pt reports some radiation to front. Pt denies hx of kidney stones. Pt denies urinary symptoms. UC found trace blood in urine.

## 2023-07-21 NOTE — ED Provider Notes (Signed)
 53 year old male with right flank pain.  Currently pending CT scan results to rule out stone as he had noted hematuria.  No nausea or vomiting.  No chest pain or shortness of breath Physical Exam  BP (!) 137/90   Pulse 64   Temp 97.6 F (36.4 C) (Oral)   Resp 17   Ht 5\' 6"  (1.676 m)   Wt 86.1 kg   SpO2 98%   BMI 30.64 kg/m   Physical Exam Constitutional:      Appearance: He is well-developed.  HENT:     Head: Normocephalic and atraumatic.  Eyes:     Conjunctiva/sclera: Conjunctivae normal.  Cardiovascular:     Rate and Rhythm: Normal rate.  Pulmonary:     Effort: Pulmonary effort is normal. No respiratory distress.  Musculoskeletal:        General: Normal range of motion.     Cervical back: Normal range of motion.  Skin:    General: Skin is warm.     Findings: No rash.  Neurological:     Mental Status: He is alert and oriented to person, place, and time.  Psychiatric:        Behavior: Behavior normal.        Thought Content: Thought content normal.    Mild right flank pain/tenderness to exam.  Mild musculoskeletal tenderness.  No abdominal tenderness.  He does not appear to be in any distress.  Ambulatory in the room with no antalgic gait. Procedures  Procedures CLINICAL DATA:  Abdominal and flank pain with stone suspected. Ongoing right flank pain for a few days. Trace blood in the urine on urinalysis.   EXAM: CT ABDOMEN AND PELVIS WITHOUT CONTRAST   TECHNIQUE: Multidetector CT imaging of the abdomen and pelvis was performed following the standard protocol without IV contrast.   RADIATION DOSE REDUCTION: This exam was performed according to the departmental dose-optimization program which includes automated exposure control, adjustment of the mA and/or kV according to patient size and/or use of iterative reconstruction technique.   COMPARISON:  Abdominal radiographs 10/24/2019   FINDINGS: Lower chest: Lung bases are clear.   Hepatobiliary: No focal liver  abnormality is seen. No gallstones, gallbladder wall thickening, or biliary dilatation.   Pancreas: Unremarkable. No pancreatic ductal dilatation or surrounding inflammatory changes.   Spleen: Normal in size without focal abnormality.   Adrenals/Urinary Tract: No adrenal gland nodules. Stone in the lower pole left kidney measuring 2 mm diameter. No right renal stones. No hydronephrosis or hydroureter on either side. No ureteral stones or bladder stones. Bladder is decompressed.   Stomach/Bowel: Stomach is within normal limits. Appendix appears normal. No evidence of bowel wall thickening, distention, or inflammatory changes.   Vascular/Lymphatic: Aortic atherosclerosis. No enlarged abdominal or pelvic lymph nodes.   Reproductive: Prostate is unremarkable.   Other: Minimal periumbilical hernia containing fat. No free air or free fluid in the abdomen.   Musculoskeletal: No acute or significant osseous findings.   IMPRESSION: 1. Nonobstructing intrarenal stone on the left. 2. No ureteral stones or obstruction demonstrated. 3. Mild aortic atherosclerosis. ED Course / MDM    Medical Decision Making Amount and/or Complexity of Data Reviewed Labs: ordered. Radiology: ordered.  Risk Prescription drug management.   53 year old male with right flank pain.  Vital signs stable, afebrile.  Blood work showed a slightly elevated white count of 10.7.  Urinalysis with hematuria.  CT scan obtained to evaluate for stone which did not show a right ureteral stone only a nonobstructing left intrarenal  stone.  Patient's symptoms much improved with Toradol  and Flexeril .  We discussed possibility of passing a right ureteral stone as well as possibility of musculoskeletal right lower back pain.  Patient will continue with over-the-counter medications and we will prescribe him Flexeril  to take as needed for any additional pain.  He understands signs and symptoms return to the ER for.        Coralyn Derry, PA-C 07/21/23 2052    Bradler, Evan K, MD 07/22/23 [phone]

## 2023-07-21 NOTE — Discharge Instructions (Addendum)
 Please use ibuprofen (Motrin) up to 800 mg every 8 hours, naproxen  (Naprosyn ) up to 500 mg every 12 hours, and/or acetaminophen (Tylenol) up to 4 g/day for any continued pain.  Please do not use this medication regimen for longer than 7 days.  You may use Flexeril  as needed for any additional pain/muscle spasms, tightness in the lower back.

## 2023-07-26 ENCOUNTER — Ambulatory Visit: Admitting: Family Medicine

## 2023-10-26 IMAGING — CT CT HEAD W/O CM
4 series · 17 of 47 positions shown, 19 images · non-contrast
Comparison: CT head 09/19/2016

CLINICAL DATA: Dizziness



[Series 2: head wo · axial · 0.44mm/px · z∈[-96,+24]mm · 7 of 34 slices shown, 9 images]
[im 5/34  brain]
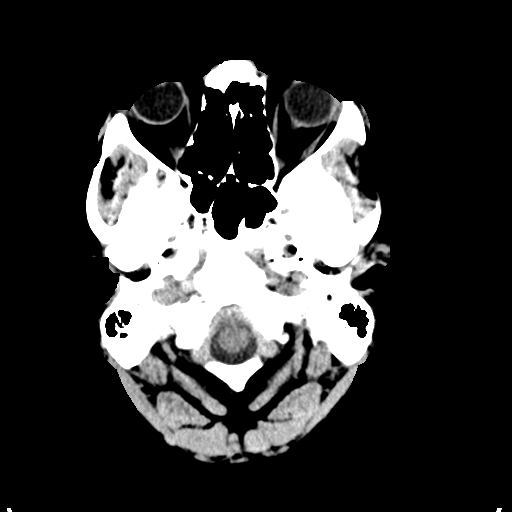
[im 5/34  bone]
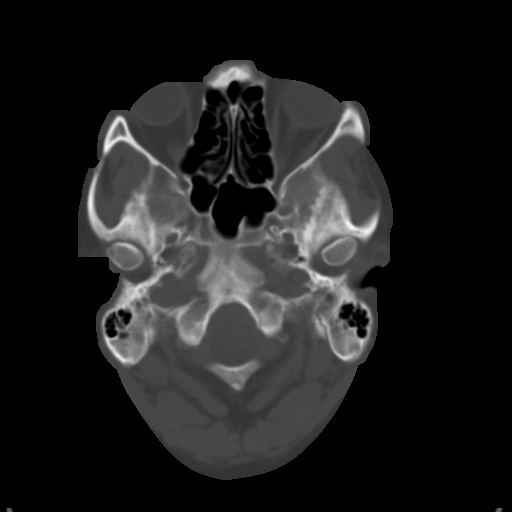
[im 9/34  brain]
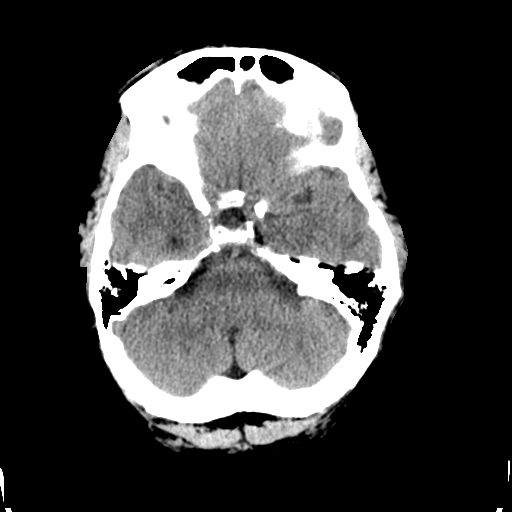
[im 13/34  brain]
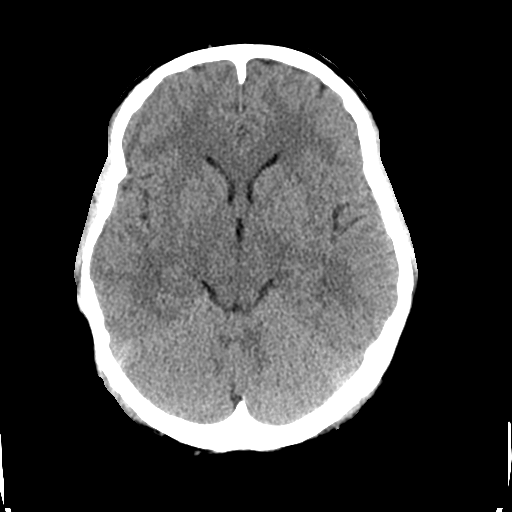
[im 17/34  brain]
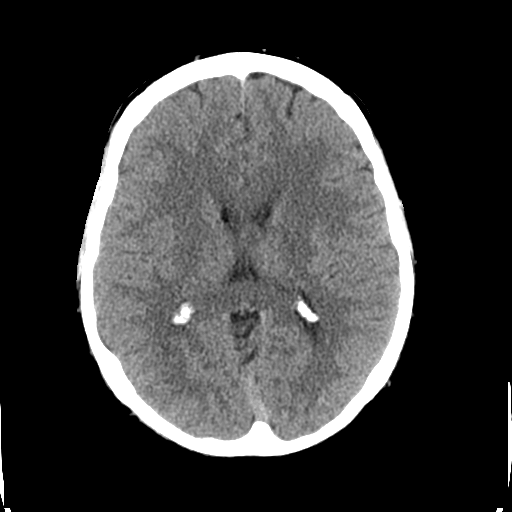
[im 21/34  brain]
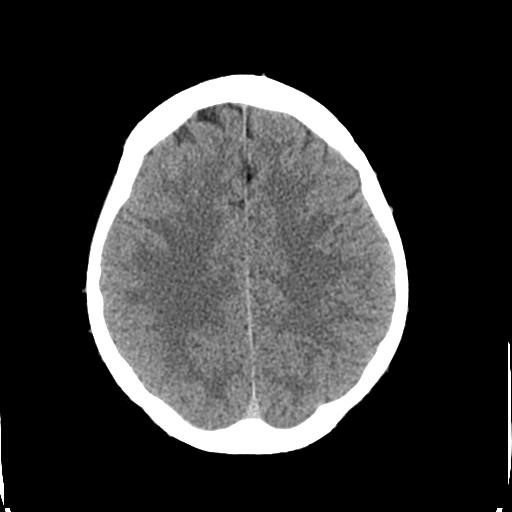
[im 21/34  bone]
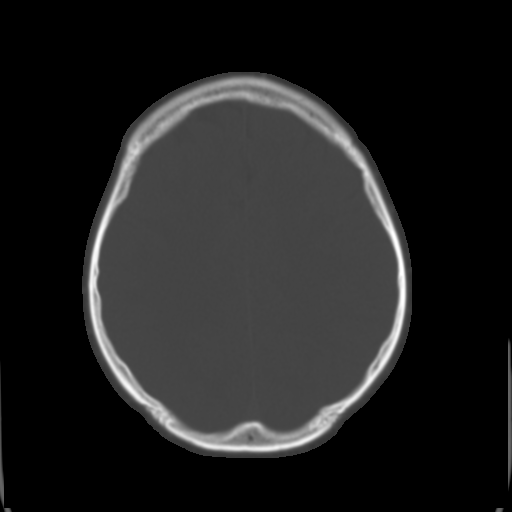
[im 25/34  brain]
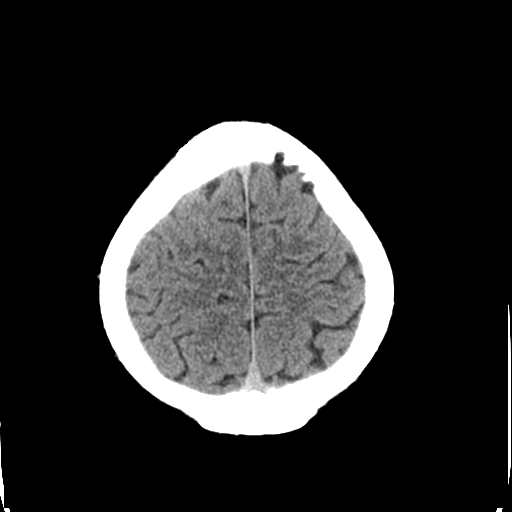
[im 29/34  brain]
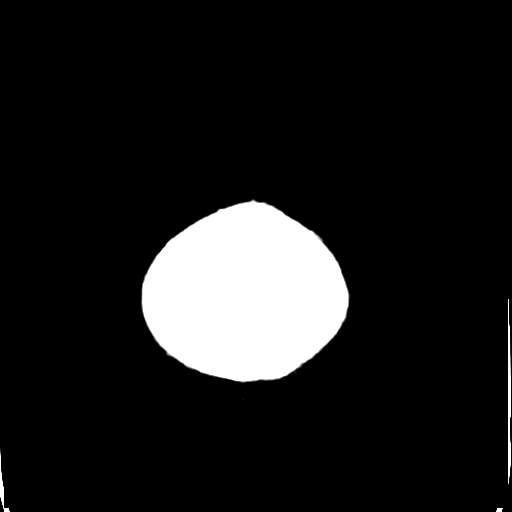

[Series 3: head bone · axial · 0.44mm/px · z∈[-100,-44]mm · 4 of 83 slices shown]
[im 9/83  bone]
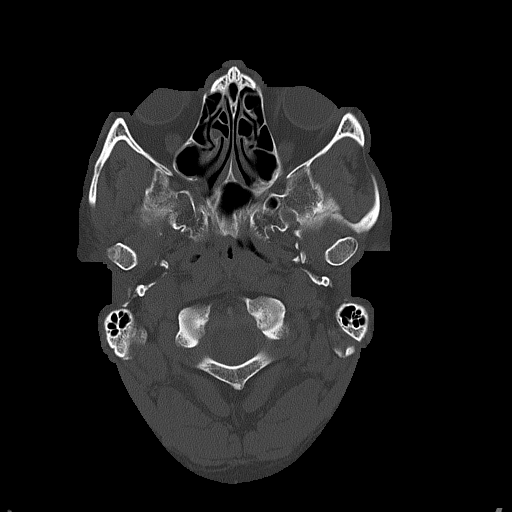
[im 17/83  bone]
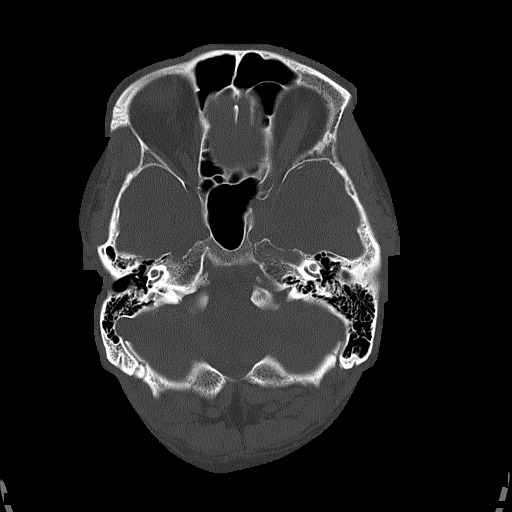
[im 25/83  bone]
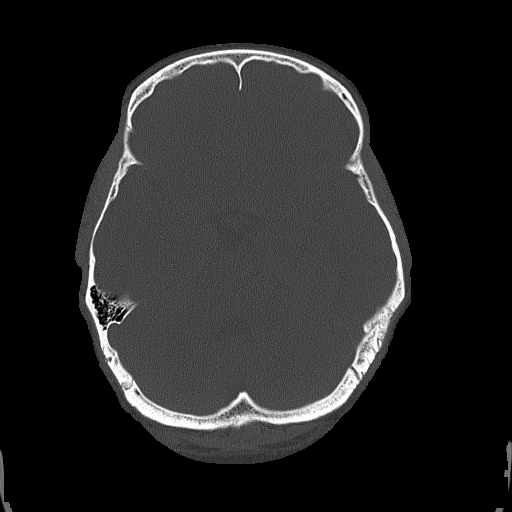
[im 37/83  bone]
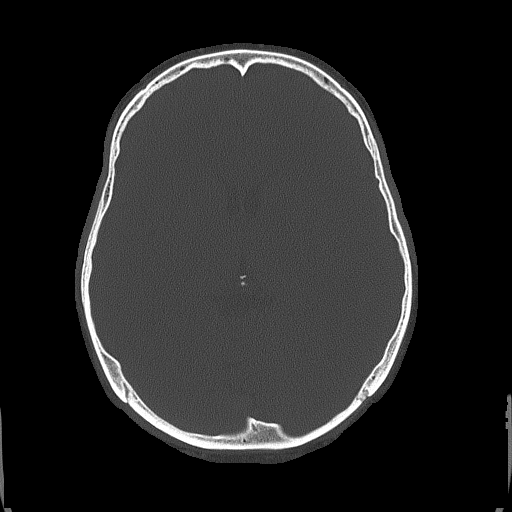

[Series 4: coronal soft tissue · coronal · 0.35mm/px · 3 of 78 slices shown]
[im 26/78  brain]
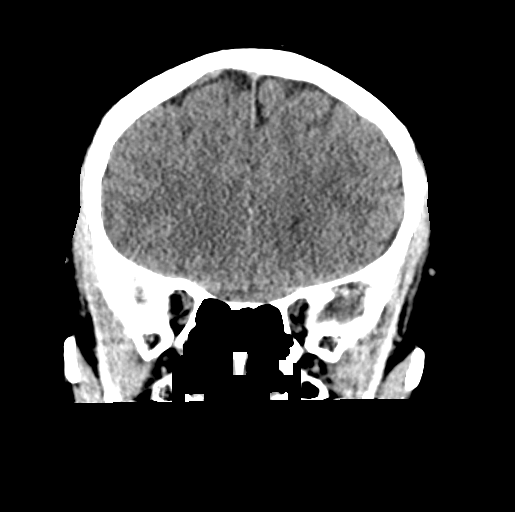
[im 35/78  brain]
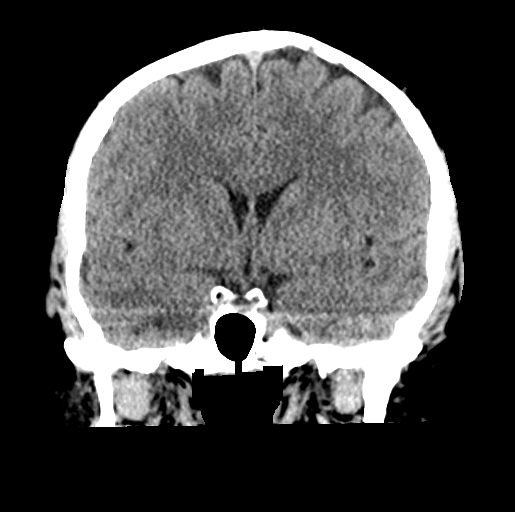
[im 43/78  brain]
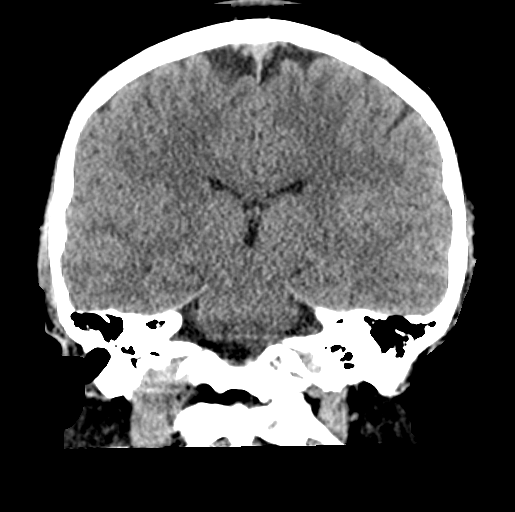

[Series 5: sagittal soft tissue · sagittal · 0.38mm/px · 3 of 61 slices shown]
[im 21/61  brain]
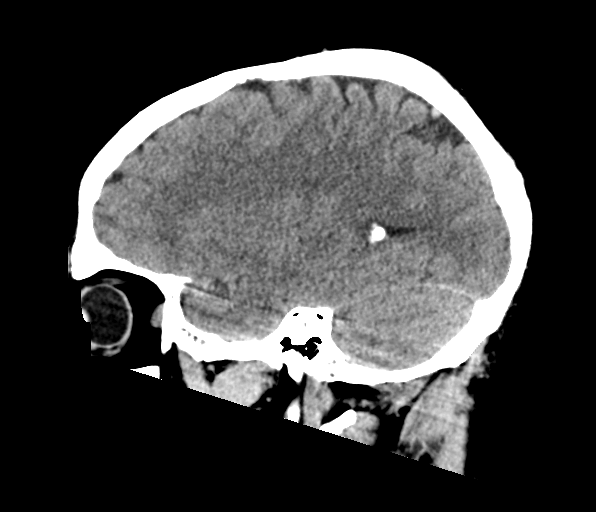
[im 31/61  brain]
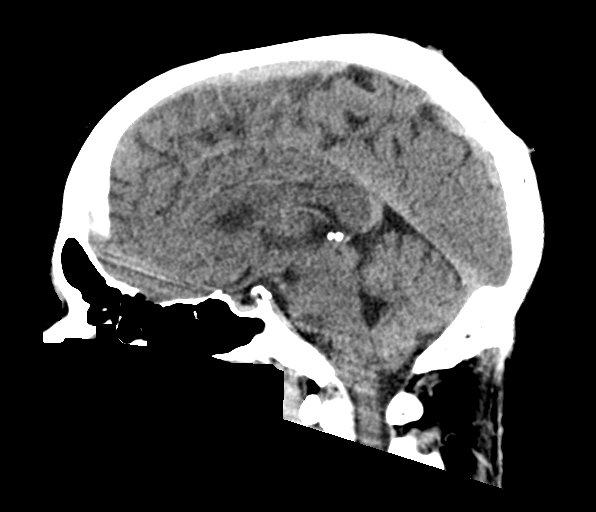
[im 41/61  brain]
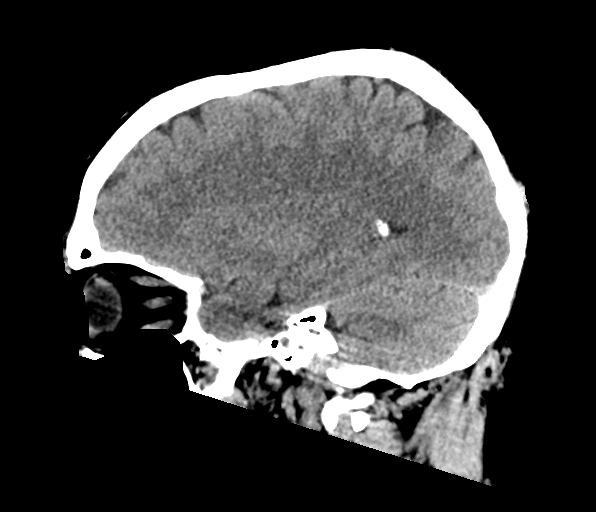

[17 of 47 positions shown; findings below may reference images not displayed]

FINDINGS: Brain: No acute intracranial hemorrhage, mass effect, or herniation.
No extra-axial fluid collections. No evidence of acute territorial
infarct. No hydrocephalus.

Vascular: No hyperdense vessel or unexpected calcification.

Skull: Normal. Negative for fracture or focal lesion.

Sinuses/Orbits: No acute finding.

Other: None.
IMPRESSION: No acute intracranial process identified.

## 2023-11-27 ENCOUNTER — Emergency Department
Admission: EM | Admit: 2023-11-27 | Discharge: 2023-11-27 | Disposition: A | Discharge status: Home / Self Care | Attending: Emergency Medicine | Admitting: Emergency Medicine

## 2023-11-27 ENCOUNTER — Other Ambulatory Visit: Payer: Self-pay

## 2023-11-27 DIAGNOSIS — R55 Syncope and collapse: Secondary | ICD-10-CM | POA: Insufficient documentation

## 2023-11-27 DIAGNOSIS — M25511 Pain in right shoulder: Secondary | ICD-10-CM | POA: Diagnosis present

## 2023-11-27 DIAGNOSIS — M542 Cervicalgia: Secondary | ICD-10-CM | POA: Insufficient documentation

## 2023-11-27 DIAGNOSIS — M545 Low back pain, unspecified: Secondary | ICD-10-CM | POA: Insufficient documentation

## 2023-11-27 DIAGNOSIS — D72829 Elevated white blood cell count, unspecified: Secondary | ICD-10-CM | POA: Insufficient documentation

## 2023-11-27 LAB — CBC
HCT: 43.8 % (ref 39.0–52.0)
Hemoglobin: 14.3 g/dL (ref 13.0–17.0)
MCH: 26.7 pg (ref 26.0–34.0)
MCHC: 32.6 g/dL (ref 30.0–36.0)
MCV: 81.9 fL (ref 80.0–100.0)
Platelets: 413 K/uL — ABNORMAL HIGH (ref 150–400)
RBC: 5.35 MIL/uL (ref 4.22–5.81)
RDW: 14.8 % (ref 11.5–15.5)
WBC: 13.7 K/uL — ABNORMAL HIGH (ref 4.0–10.5)
nRBC: 0 % (ref 0.0–0.2)

## 2023-11-27 LAB — COMPREHENSIVE METABOLIC PANEL WITH GFR
ALT: 22 U/L (ref 0–44)
AST: 17 U/L (ref 15–41)
Albumin: 4 g/dL (ref 3.5–5.0)
Alkaline Phosphatase: 65 U/L (ref 38–126)
Anion gap: 9 (ref 5–15)
BUN: 13 mg/dL (ref 6–20)
CO2: 24 mmol/L (ref 22–32)
Calcium: 8.9 mg/dL (ref 8.9–10.3)
Chloride: 105 mmol/L (ref 98–111)
Creatinine, Ser: 0.68 mg/dL (ref 0.61–1.24)
GFR, Estimated: >60 mL/min (ref >60)
Glucose, Bld: 107 mg/dL — ABNORMAL HIGH (ref 70–99)
Potassium: 4 mmol/L (ref 3.5–5.1)
Sodium: 138 mmol/L (ref 135–145)
Total Bilirubin: 0.5 mg/dL (ref 0.0–1.2)
Total Protein: 7.7 g/dL (ref 6.5–8.1)

## 2023-11-27 LAB — URINALYSIS, ROUTINE W REFLEX MICROSCOPIC
Bilirubin Urine: NEGATIVE
Glucose, UA: NEGATIVE mg/dL
Hgb urine dipstick: NEGATIVE
Ketones, ur: NEGATIVE mg/dL
Leukocytes,Ua: NEGATIVE
Nitrite: NEGATIVE
Protein, ur: NEGATIVE mg/dL
Specific Gravity, Urine: 1.024 (ref 1.005–1.030)
pH: 7 (ref 5.0–8.0)

## 2023-11-27 LAB — TROPONIN I (HIGH SENSITIVITY): Troponin I (High Sensitivity): <2 ng/L (ref <18)

## 2023-11-27 MED ORDER — CYCLOBENZAPRINE HCL 10 MG PO TABS: 10.0000 mg | ORAL_TABLET | Freq: Three times a day (TID) | ORAL | 0 refills | Status: AC | PRN

## 2023-11-27 MED ORDER — LIDOCAINE 5 % EX PTCH
1.0000 | MEDICATED_PATCH | CUTANEOUS | 0 refills | Status: AC
Start: 2023-11-27 — End: 2023-12-07

## 2023-11-27 NOTE — ED Notes (Signed)
 See triage note Presents with some lower back pain since yesterday  Denies any injury  But has had recent renal stones

## 2023-11-27 NOTE — ED Notes (Signed)
 Called lab to add on troponin at this time

## 2023-11-27 NOTE — ED Triage Notes (Signed)
 Pt to ED via POV from home. Pt reports mid/lower back pain and right shoulder pain since yesterday. Pt also reports had a syncopal episode yesterday and a funeral. Pt remembers waking up with family around him. Pt reports hx of kidney stone.

## 2023-11-27 NOTE — ED Provider Notes (Signed)
 [facility] Provider Note   Event Date/Time   First MD Initiated Contact with Patient 11/27/23 [phone]     (approximate) History  Back Pain, Shoulder Pain, and Loss of Consciousness  HPI Donald Benitez is a 53 y.o. male with a stated past medical history of hypercholesterolemia, obesity, and migraines who presents complaining of right shoulder pain and low back pain that has been present for the last month.  Patient states that the pain was bad enough yesterday that she did not pass out at a funeral.  Patient states that he was feeling extreme pain in his low back when he was trying to walk to his car after the funeral and began getting lightheaded.  Patient states that he does not remember going to the ground but does member waking up with people standing around him.  Bystanders state that he was unconscious for less than 30 seconds before returning to consciousness and being immediately oriented however diaphoretic.  Patient denies any worsening pain after this fall.  Patient denies any head trauma.  Patient states he has had right shoulder and low back pain for the last month with the right shoulder pain radiating into the right neck as well as down the back of the right arm.  Patient denies any exacerbating or relieving factors.  Patient's low back pain has been present for the same time however is significantly exacerbated by standing or lifting.  Patient states that he does lift heavy materials at his work but denies any feeling of pop or snap prior to the symptoms beginning.  Patient denies any pain shooting down either leg or bowel/bladder incontinence.  Patient denies any saddle anesthesia. ROS: Patient currently denies any vision changes, tinnitus, difficulty speaking, facial droop, sore throat, chest pain, shortness of breath, abdominal pain, nausea/vomiting/diarrhea, dysuria, or weakness/numbness/paresthesias in any extremity   Physical Exam  Triage Vital Signs: ED  Triage Vitals  Encounter Vitals Group     BP 11/27/23 0828 137/89     Girls Systolic BP Percentile --      Girls Diastolic BP Percentile --      Boys Systolic BP Percentile --      Boys Diastolic BP Percentile --      Pulse Rate 11/27/23 0828 78     Resp 11/27/23 0828 18     Temp 11/27/23 0828 98.1 F (36.7 C)     Temp Source 11/27/23 0828 Oral     SpO2 11/27/23 0828 99 %     Weight --      Height --      Head Circumference --      Peak Flow --      Pain Score 11/27/23 0831 5     Pain Loc --      Pain Education --      Exclude from Growth Chart --    Most recent vital signs: Vitals:   11/27/23 0828  BP: 137/89  Pulse: 78  Resp: 18  Temp: 98.1 F (36.7 C)  SpO2: 99%   General: Awake, oriented x4. CV:  Good peripheral perfusion. Resp:  Normal effort. Abd:  No distention. Other:  Middle-aged obese African-American male resting comfortably in no acute distress.  Mild tenderness to palpation over the posterior aspect of the shoulder.  Patient also has tenderness to palpation over the right cervical paraspinal musculature.  Patient has tenderness bilaterally over SI joints ED Results / Procedures / Treatments  Labs (all labs ordered are listed, but  only abnormal results are displayed) Labs Reviewed  COMPREHENSIVE METABOLIC PANEL WITH GFR - Abnormal; Notable for the following components:      Result Value   Glucose, Bld 107 (*)    All other components within normal limits  CBC - Abnormal; Notable for the following components:   WBC 13.7 (*)    Platelets 413 (*)    All other components within normal limits  URINALYSIS, ROUTINE W REFLEX MICROSCOPIC - Abnormal; Notable for the following components:   Color, Urine YELLOW (*)    APPearance CLEAR (*)    All other components within normal limits  LIPASE, BLOOD  TROPONIN I (HIGH SENSITIVITY)   PROCEDURES: Critical Care performed: No Procedures MEDICATIONS ORDERED IN ED: Medications - No data to display IMPRESSION / MDM /  ASSESSMENT AND PLAN / ED COURSE  I reviewed the triage vital signs and the nursing notes.                             The patient is on the cardiac monitor to evaluate for evidence of arrhythmia and/or significant heart rate changes. Patient's presentation is most consistent with acute presentation with potential threat to life or bodily function. Patient is a 53 year old male with the above-stated past medical history presents complaining of right shoulder and low back pain has been present over the last month as well as a syncopal episode yesterday. DDx: Rotator cuff injury, low back musculoskeletal disease, vasovagal syncope, orthostatic syncope Plan: CBC, CMP, UA, troponin, EKG Pertinent positives/negatives BC showing leukocytosis to 13.7 with elevated platelets 03/28/2011 CMP WNL UA shows no evidence of infection or hematuria EKG nonischemic  Discussed these results with patient as well as the necessity for imaging today.  Patient agrees with plan for no x-ray of the shoulder or low back with anticipation of following up with orthopedics if conservative management does not improve his symptoms.  Patient expressed understanding that he may need an MRI at this time if symptoms do not improve.  Patient agrees with plan for discharge at this time, all questions answered, and strict return precautions given  Dispo: Discharge home with orthopedic follow-up as needed   FINAL CLINICAL IMPRESSION(S) / ED DIAGNOSES   Final diagnoses:  Lumbar back pain  Acute pain of right shoulder   Rx / DC Orders   ED Discharge Orders          Ordered    cyclobenzaprine  (FLEXERIL ) 10 MG tablet  3 times daily PRN        11/27/23 0927    lidocaine (LIDODERM) 5 %  Every 24 hours        11/27/23 9072           Note:  This document was prepared using Dragon voice recognition software and may include unintentional dictation errors.   Jossie Artist POUR, MD 11/27/23 [phone]

## 2024-01-17 ENCOUNTER — Encounter: Payer: Self-pay | Admitting: Family Medicine

## 2024-01-17 ENCOUNTER — Ambulatory Visit: Admitting: Family Medicine

## 2024-01-17 VITALS — BP 115/53 | HR 72 | Resp 16 | Ht 66.0 in | Wt 190.0 lb

## 2024-01-17 DIAGNOSIS — Z87898 Personal history of other specified conditions: Secondary | ICD-10-CM

## 2024-01-17 DIAGNOSIS — L308 Other specified dermatitis: Secondary | ICD-10-CM

## 2024-01-17 DIAGNOSIS — I10 Essential (primary) hypertension: Secondary | ICD-10-CM

## 2024-01-17 DIAGNOSIS — H539 Unspecified visual disturbance: Secondary | ICD-10-CM

## 2024-01-17 DIAGNOSIS — G4719 Other hypersomnia: Secondary | ICD-10-CM

## 2024-01-17 DIAGNOSIS — G43009 Migraine without aura, not intractable, without status migrainosus: Secondary | ICD-10-CM

## 2024-01-17 DIAGNOSIS — Z720 Tobacco use: Secondary | ICD-10-CM | POA: Insufficient documentation

## 2024-01-17 DIAGNOSIS — R4586 Emotional lability: Secondary | ICD-10-CM

## 2024-01-17 DIAGNOSIS — E782 Mixed hyperlipidemia: Secondary | ICD-10-CM | POA: Insufficient documentation

## 2024-01-17 DIAGNOSIS — E6609 Other obesity due to excess calories: Secondary | ICD-10-CM

## 2024-01-17 DIAGNOSIS — G479 Sleep disorder, unspecified: Secondary | ICD-10-CM

## 2024-01-17 DIAGNOSIS — F1090 Alcohol use, unspecified, uncomplicated: Secondary | ICD-10-CM | POA: Insufficient documentation

## 2024-01-17 MED ORDER — ROSUVASTATIN CALCIUM 10 MG PO TABS: 10.0000 mg | ORAL_TABLET | Freq: Every day | ORAL | 3 refills | Status: AC

## 2024-01-17 MED ORDER — ROSUVASTATIN CALCIUM 10 MG PO TABS: 10.0000 mg | ORAL_TABLET | Freq: Every day | ORAL | 3 refills | Status: DC

## 2024-01-17 NOTE — Progress Notes (Signed)
 Established patient visit   Patient: Donald Benitez   DOB: [birth year]   53 y.o. Male  MRN: [id number] Visit Date: 01/17/2024  Today's healthcare provider: Rockie Agent, MD   Chief Complaint  Patient presents with   Diabetes Management Plan    DM/ Bp Concerns/    Subjective     HPI     Diabetes Management Plan    Additional comments: DM/ Bp Concerns/       Last edited by Marylen Odella LITTIE, CMA on 01/17/2024  9:08 AM.       Discussed the use of AI scribe software for clinical note transcription with the patient, who gave verbal consent to proceed.  History of Present Illness Donald Benitez is a 53 year old male who presents with concerns about diabetes and blood pressure.  He is experiencing symptoms such as a 'foggy haze' and feels better when avoiding processed foods. His A1c is 5.3, which is within the normal range. He feels foggy after consuming a ham and cheese biscuit, which led to his concern about diabetes.  He has a history of hypertension and is concerned about his blood pressure, although today's reading is 115/53. He is currently taking hydralazine 25 mg as needed.  He has a history of hyperlipidemia and is prescribed Crestor  10 mg daily, although he does not take it regularly. He wants to start taking it again and has a full bottle at home.  He has a history of passing out after a funeral, which he attributes to standing for a long time, not eating well, and consuming alcohol and marijuana. He was unconscious for about three to four minutes and felt back pain prior to the episode.  He experiences shoulder pain and has been receiving cortisone shots and taking naproxen  500 mg for relief.  He works two jobs, which affects his sleep, and he often feels tired. He drinks coffee in the morning but has reduced sugar intake by switching to Splenda.  He smokes half a pack of cigarettes daily, occasionally uses marijuana, and drinks alcohol  primarily on weekends, mixing it with orange juice.  He reports stress related to his job and the state of the world, which affects his mood. He feels more withdrawn and less sociable, preferring to be alone and watch TV. No chest pain, difficulty breathing, heart racing, fevers, or chills.     Past Medical History:  Diagnosis Date   Eczema    Hypercholesteremia     Medications: Outpatient Medications Prior to Visit  Medication Sig   triamcinolone cream (KENALOG) 0.1 %    triamcinolone cream (KENALOG) 0.5 %    tacrolimus (PROTOPIC) 0.03 % ointment APPLY A THIN LAYER TO THE AFFECTED AREA(S) AS NEEDED. (Patient not taking: Reported on 01/17/2024)   tacrolimus (PROTOPIC) 0.1 % ointment APPLY A THIN LAYER TO THE AFFECTED AREA(S) AS NEEDED. (Patient not taking: Reported on 01/17/2024)   [DISCONTINUED] hydrOXYzine (VISTARIL) 25 MG capsule hydroxyzine pamoate 25 mg capsule (Patient not taking: Reported on 01/17/2024)   [DISCONTINUED] rosuvastatin  (CRESTOR ) 10 MG tablet Take 1 tablet (10 mg total) by mouth daily. (Patient not taking: Reported on 07/18/2023)   [DISCONTINUED] triamcinolone ointment (KENALOG) 0.1 % APPLY A THIN LAYER TO THE AFFECTED AREA(S) 2-3x PER DAY, WHEN CONDITION IS SEVERE (Patient not taking: Reported on 01/17/2024)   No facility-administered medications prior to visit.    Review of Systems  BP (!) 115/53 (BP Location: Left Arm, Patient Position: Sitting)  Pulse 72   Resp 16   Ht 5' 6 (1.676 m)   Wt 190 lb (86.2 kg)   SpO2 100%   BMI 30.67 kg/m   Last CBC Lab Results  Component Value Date   WBC 13.7 (H) 11/27/2023   HGB 14.3 11/27/2023   HCT 43.8 11/27/2023   MCV 81.9 11/27/2023   MCH 26.7 11/27/2023   RDW 14.8 11/27/2023   PLT 413 (H) 11/27/2023   Last metabolic panel Lab Results  Component Value Date   GLUCOSE 107 (H) 11/27/2023   NA 138 11/27/2023   K 4.0 11/27/2023   CL 105 11/27/2023   CO2 24 11/27/2023   BUN 13 11/27/2023   CREATININE 0.68  11/27/2023   GFRNONAA >60 11/27/2023   CALCIUM  8.9 11/27/2023   PROT 7.7 11/27/2023   ALBUMIN 4.0 11/27/2023   LABGLOB 3.1 03/14/2023   BILITOT 0.5 11/27/2023   ALKPHOS 65 11/27/2023   AST 17 11/27/2023   ALT 22 11/27/2023   ANIONGAP 9 11/27/2023   Last lipids Lab Results  Component Value Date   CHOL 176 03/14/2023   HDL 42 03/14/2023   LDLCALC 112 (H) 03/14/2023   TRIG 124 03/14/2023   CHOLHDL 4.2 03/14/2023   The 10-year ASCVD risk score (Arnett DK, et al., 2019) is: 7.6%  Last hemoglobin A1c Lab Results  Component Value Date   HGBA1C 5.7 (H) 03/14/2023   Last thyroid functions Lab Results  Component Value Date   TSH 1.480 03/14/2023   FREET4 1.21 03/14/2023   Last vitamin D No results found for: 25OHVITD2, 25OHVITD3, VD25OH Last vitamin B12 and Folate Lab Results  Component Value Date   VITAMINB12 423 03/14/2023        Objective     BP Readings from Last 3 Encounters:  01/17/24 (!) 115/53  11/27/23 137/89  07/21/23 (!) 137/90   Wt Readings from Last 3 Encounters:  01/17/24 190 lb (86.2 kg)  11/27/23 189 lb 13.1 oz (86.1 kg)  07/21/23 189 lb 13.1 oz (86.1 kg)        Physical Exam Vitals reviewed.  Constitutional:      General: He is not in acute distress.    Appearance: Normal appearance. He is normal weight. He is not ill-appearing, toxic-appearing or diaphoretic.     Comments: Well groomed, calmly sitting   Cardiovascular:     Rate and Rhythm: Normal rate and regular rhythm.  Pulmonary:     Effort: Pulmonary effort is normal. No respiratory distress.     Breath sounds: No wheezing, rhonchi or rales.  Abdominal:     General: Abdomen is flat. Bowel sounds are normal. There is no distension.     Palpations: Abdomen is soft. There is no hepatomegaly or mass.     Tenderness: There is no abdominal tenderness.  Neurological:     Mental Status: He is alert and oriented to person, place, and time.  Psychiatric:        Attention and  Perception: Attention and perception normal. He is attentive. He does not perceive auditory or visual hallucinations.        Mood and Affect: Mood and affect normal.        Speech: Speech normal.        Behavior: Behavior normal. Behavior is cooperative.        Thought Content: Thought content normal. Thought content is not paranoid or delusional. Thought content does not include homicidal or suicidal ideation. Thought content does not include homicidal or suicidal  plan.        Judgment: Judgment normal.       No results found for any visits on 01/17/24.  Assessment & Plan     Problem List Items Addressed This Visit     Alcohol use   Chronic  Reports increased alcohol consumption, particularly on weekends, mixing alcohol with orange juice. Consumes 3-4 mixed drinks on weekends. Advised to reduce alcohol intake to improve overall health. - Advised to limit alcohol intake to 14 drinks per week and no more than 4 drinks in one sitting.      Class 1 obesity due to excess calories with body mass index (BMI) of 31.0 to 31.9 in adult   Chronic  BMI is 30.67, consistent with class 1 obesity. Weight is stable since May. Reports dietary changes with reduced processed food intake, leading to weight loss.      Eczema   Chronic  Not currently using topical therapies  Pt plans to follow up with derm       Migraine without aura and without status migrainosus, not intractable   Chronic  Reports mild HA since last visit  Not on prophylactic HA regimen currently       Relevant Medications   rosuvastatin  (CRESTOR ) 10 MG tablet   Mixed hyperlipidemia   Mixed hyperlipidemia Chronic  Previously elevated cholesterol levels. Currently on Crestor  10 mg daily. Reports not taking medication regularly due to concerns about medication use. - Refilled Crestor  10 mg. - Ordered blood work to check cholesterol levels.      Relevant Medications   rosuvastatin  (CRESTOR ) 10 MG tablet   Other Relevant  Orders   Lipid panel   Primary hypertension - Primary   Essential hypertension chronic Blood pressure is 115/53,diastolic  slightly low. No new antihypertensive medication needed.      Relevant Medications   rosuvastatin  (CRESTOR ) 10 MG tablet   Other Relevant Orders   CMP14+EGFR   Tobacco use   Smoking/Tobacco Cessation Counseling Donald Benitez is a current user of tobacco or nicotine products. He is not ready to quit at this time. Counseling provided today addressed the risks of continued use and the benefits of cessation.    Total time spent on counseling: 4 minutes.        Other Visit Diagnoses       Visual changes         Mood changes       Relevant Orders   CBC   TSH + free T4   Vitamin B12     History of syncope       Relevant Orders   CBC   TSH + free T4   Vitamin B12     Sleep disturbance       Relevant Orders   Home sleep test     Excessive daytime sleepiness       Relevant Orders   Home sleep test       Assessment and Plan Assessment & Plan Adult Wellness Visit Routine adult wellness visit with normal A1c at 5.3, ruling out diabetes. Blood pressure is 115/53, slightly low. BMI is 30.67, indicating class 1 obesity. Reports stress related to work and world events, affecting mood and sleep. - Scheduled follow-up in 3 months to review cholesterol levels and sleep study results.    Visual disturbance Reports foggy vision and blurry vision. Recently visited eye doctor and awaiting glasses, expected in 10 days. - Await glasses to assess improvement in vision.  Sleep disturbance  Reports difficulty sleeping, possibly related to work stress and alcohol use. Drinks coffee in the morning, which may contribute to sleep issues. - Ordered sleep study to evaluate for sleep apnea.  Syncope, single episode Reported a single episode of syncope after standing for a long time, possibly related to dehydration, alcohol, and marijuana use. No recurrent episodes  reported. - Continue to monitor for any further episodes of syncope.   Work-related stress Reports significant stress related to work environment, including racism and microaggressions. Plans to seek new employment after the holidays. No interest in medication or counseling for stress management. - Encouraged seeking new employment to alleviate work-related stress.     Return in about 3 months (around 04/18/2024) for BP, cholesterol .         Rockie Agent, MD  [facility] [phone] (phone) [phone] (fax)  [facility]

## 2024-01-17 NOTE — Assessment & Plan Note (Signed)
 Essential hypertension chronic Blood pressure is 115/53,diastolic  slightly low. No new antihypertensive medication needed.

## 2024-01-17 NOTE — Patient Instructions (Signed)
 To keep you healthy, please keep in mind the following health maintenance items that you are due for:   Health Maintenance Due  Topic Date Due   DTaP/Tdap/Td (1 - Tdap) Never done   Pneumococcal Vaccine: 50+ Years (1 of 2 - PCV) Never done   Hepatitis B Vaccines 19-59 Average Risk (1 of 3 - 19+ 3-dose series) Never done   Zoster Vaccines- Shingrix (1 of 2) Never done   Influenza Vaccine  09/22/2023   COVID-19 Vaccine (1 - 2025-26 season) Never done     Best Wishes,   Dr. Lang

## 2024-01-17 NOTE — Assessment & Plan Note (Signed)
 Chronic  Reports increased alcohol consumption, particularly on weekends, mixing alcohol with orange juice. Consumes 3-4 mixed drinks on weekends. Advised to reduce alcohol intake to improve overall health. - Advised to limit alcohol intake to 14 drinks per week and no more than 4 drinks in one sitting.

## 2024-01-17 NOTE — Assessment & Plan Note (Signed)
 Chronic  Reports mild HA since last visit  Not on prophylactic HA regimen currently

## 2024-01-17 NOTE — Assessment & Plan Note (Signed)
 Smoking/Tobacco Cessation Counseling Donald Benitez is a current user of tobacco or nicotine products. He is not ready to quit at this time. Counseling provided today addressed the risks of continued use and the benefits of cessation.    Total time spent on counseling: 4 minutes.

## 2024-01-17 NOTE — Assessment & Plan Note (Signed)
 Chronic  Not currently using topical therapies  Pt plans to follow up with derm

## 2024-01-17 NOTE — Assessment & Plan Note (Signed)
 Mixed hyperlipidemia Chronic  Previously elevated cholesterol levels. Currently on Crestor  10 mg daily. Reports not taking medication regularly due to concerns about medication use. - Refilled Crestor  10 mg. - Ordered blood work to check cholesterol levels.

## 2024-01-17 NOTE — Assessment & Plan Note (Signed)
 Chronic  BMI is 30.67, consistent with class 1 obesity. Weight is stable since May. Reports dietary changes with reduced processed food intake, leading to weight loss.

## 2024-01-18 LAB — CMP14+EGFR
ALT: 21 IU/L (ref 0–44)
AST: 16 IU/L (ref 0–40)
Albumin: 4.2 g/dL (ref 4.1–5.1)
Alkaline Phosphatase: 90 IU/L (ref 47–123)
BUN/Creatinine Ratio: 15 (ref 9–20)
BUN: 14 mg/dL (ref 6–24)
Bilirubin Total: 0.3 mg/dL (ref 0.0–1.2)
CO2: 25 mmol/L (ref 20–29)
Calcium: 9.4 mg/dL (ref 8.7–10.2)
Chloride: 103 mmol/L (ref 96–106)
Creatinine, Ser: 0.94 mg/dL (ref 0.76–1.27)
Globulin, Total: 2.8 g/dL (ref 1.5–4.5)
Glucose: 98 mg/dL (ref 70–99)
Potassium: 4.1 mmol/L (ref 3.5–5.2)
Sodium: 140 mmol/L (ref 134–144)
Total Protein: 7 g/dL (ref 6.0–8.5)
eGFR: 99 mL/min/1.73 (ref >59)

## 2024-01-18 LAB — TSH+FREE T4
Free T4: 1.36 ng/dL (ref 0.82–1.77)
TSH: 0.868 u[IU]/mL (ref 0.450–4.500)

## 2024-01-18 LAB — LIPID PANEL
Chol/HDL Ratio: 3.5 ratio (ref 0.0–5.0)
Cholesterol, Total: 162 mg/dL (ref 100–199)
HDL: 46 mg/dL (ref >39)
LDL Chol Calc (NIH): 98 mg/dL (ref 0–99)
Triglycerides: 97 mg/dL (ref 0–149)
VLDL Cholesterol Cal: 18 mg/dL (ref 5–40)

## 2024-01-18 LAB — CBC
Hematocrit: 44.2 % (ref 37.5–51.0)
Hemoglobin: 14 g/dL (ref 13.0–17.7)
MCH: 26.7 pg (ref 26.6–33.0)
MCHC: 31.7 g/dL (ref 31.5–35.7)
MCV: 84 fL (ref 79–97)
Platelets: 422 x10E3/uL (ref 150–450)
RBC: 5.24 x10E6/uL (ref 4.14–5.80)
RDW: 13.3 % (ref 11.6–15.4)
WBC: 11.3 x10E3/uL — ABNORMAL HIGH (ref 3.4–10.8)

## 2024-01-18 LAB — VITAMIN B12: Vitamin B-12: 286 pg/mL (ref 232–1245)

## 2024-01-23 ENCOUNTER — Ambulatory Visit: Payer: Self-pay | Admitting: Family Medicine

## 2024-04-18 ENCOUNTER — Ambulatory Visit: Admitting: Family Medicine
